# Patient Record
Sex: Female | Born: 1950 | Race: White | Hispanic: No | Smoking: Current every day smoker
Health system: Southern US, Community
[De-identification: ages and names within clinical notes are randomized; demographics above are authoritative.]

## PROBLEM LIST (undated history)

## (undated) HISTORY — PX: ABDOMINAL HYSTERECTOMY: SHX81

## (undated) HISTORY — PX: TONSILLECTOMY: SUR1361

---

## 2007-04-30 ENCOUNTER — Ambulatory Visit: Payer: Self-pay | Admitting: Family Medicine

## 2010-04-04 ENCOUNTER — Ambulatory Visit: Payer: Self-pay | Admitting: Internal Medicine

## 2010-04-06 ENCOUNTER — Ambulatory Visit: Payer: Self-pay | Admitting: Internal Medicine

## 2012-01-09 ENCOUNTER — Encounter: Payer: Self-pay | Admitting: Family Medicine

## 2012-01-09 ENCOUNTER — Ambulatory Visit (INDEPENDENT_AMBULATORY_CARE_PROVIDER_SITE_OTHER): Payer: Commercial Managed Care - PPO | Admitting: Family Medicine

## 2012-01-09 VITALS — BP 104/70 | HR 80 | Temp 98.1°F | Ht 65.25 in | Wt 182.0 lb

## 2012-01-09 DIAGNOSIS — R21 Rash and other nonspecific skin eruption: Secondary | ICD-10-CM

## 2012-01-09 DIAGNOSIS — Z1231 Encounter for screening mammogram for malignant neoplasm of breast: Secondary | ICD-10-CM

## 2012-01-09 DIAGNOSIS — N63 Unspecified lump in unspecified breast: Secondary | ICD-10-CM

## 2012-01-09 DIAGNOSIS — N6310 Unspecified lump in the right breast, unspecified quadrant: Secondary | ICD-10-CM

## 2012-01-09 MED ORDER — FLUOCINONIDE-E 0.05 % EX CREA: TOPICAL_CREAM | Freq: Two times a day (BID) | CUTANEOUS | Status: DC

## 2012-01-09 MED ORDER — DOXYCYCLINE HYCLATE 100 MG PO TABS: 100.0000 mg | ORAL_TABLET | Freq: Two times a day (BID) | ORAL | Status: DC

## 2012-01-09 NOTE — Patient Instructions (Addendum)
Please start Zyrtec- 1 tablet daily. Please take doxycycline 100 mg twice daily x 10 days. Lidex cream twice daily until rash resolves. If no improvement over the next 10 days, please call me.  Please stop by to see Shirlee Limerick on your way out- she will set up your mammogram.

## 2012-01-09 NOTE — Progress Notes (Signed)
  Subjective:    Patient ID: Dorothy Savage, female    DOB: 1950/11/27, 61 y.o.   MRN: [id number]  HPI Very pleasant 61 yo female new to our practice here for two skin issues.  1.  Raised, itchy rash on her arms- comes and goes.  Seems to be worse when she is outside or working as a Lawyer at [employer]. Does not wear gloves that frequently at work.  Rash does not seem to appear after she wears gloves.  2.  Redness, pain cracking finger tips left hand- started a few weeks ago.  Has never drained pus.  She has been putting abx ointment on them which has helped a little. She is a gardener, loves to dig in the soil.  No fevers, chills or weight loss.  PMH significant for sulfa allergy.  Patient Active Problem List  Diagnosis  . Rash and nonspecific skin eruption   No past medical history on file. Past Surgical History  Procedure Date  . Abdominal hysterectomy    History  Substance Use Topics  . Smoking status: Current Every Day Smoker  . Smokeless tobacco: Not on file  . Alcohol Use: Not on file   Family History  Problem Relation Age of Onset  . Alcohol abuse Father    Allergies  Allergen Reactions  . Sulfa Antibiotics Nausea And Vomiting   No current outpatient prescriptions on file prior to visit.   The PMH, PSH, Social History, Family History, Medications, and allergies have been reviewed in [facility], and have been updated if relevant.    Review of Systems See HPI     Objective:   Physical Exam BP 104/70  Pulse 80  Temp 98.1 F (36.7 C)  Ht 5' 5.25" (1.657 m)  Wt 182 lb (82.555 kg)  BMI 30.05 kg/m2 Gen:  Alert, pleasant, NAD Skin: 3 rd - 5th finger tips on left hand, cracking with lateral erythema and warmth- not involving nail or nailbeds- no evidence of paronychia. Raised erythematous macular rash on forearms bilaterally, no drainage.    Assessment & Plan:   1. Rash and nonspecific skin eruption     Two separate rashes- her rash on her fingers tips is  concerning for cellulitis- will start on course of doxycycline (sulfa allergic) to cover MRSA given her exposure in the nursing home. Her arm rashes appear like an allergic or contact dermatitis.  Will start with Lidex- for inflammation and itching.  Add Zyrtec.  ? If this is due the soap they use at the SNF.  I did advise allergy testing.  She will think about it.

## 2012-01-09 NOTE — Addendum Note (Signed)
Addended by: Dianne Dun on: 01/09/2012 09:47 AM   Modules accepted: Orders

## 2012-01-09 NOTE — Addendum Note (Signed)
Addended by: Dianne Dun on: 01/09/2012 08:59 AM   Modules accepted: Orders

## 2012-01-30 ENCOUNTER — Ambulatory Visit: Payer: Self-pay | Admitting: Family Medicine

## 2012-01-31 ENCOUNTER — Encounter: Payer: Self-pay | Admitting: Family Medicine

## 2012-01-31 ENCOUNTER — Encounter: Payer: Self-pay | Admitting: *Deleted

## 2012-02-28 ENCOUNTER — Other Ambulatory Visit: Payer: Self-pay | Admitting: Family Medicine

## 2012-02-28 MED ORDER — FLUOCINONIDE 0.05 % EX SOLN: Freq: Three times a day (TID) | CUTANEOUS | Status: DC

## 2012-03-26 ENCOUNTER — Ambulatory Visit (INDEPENDENT_AMBULATORY_CARE_PROVIDER_SITE_OTHER): Payer: Commercial Managed Care - PPO | Admitting: Family Medicine

## 2012-03-26 ENCOUNTER — Ambulatory Visit: Payer: Self-pay | Admitting: Family Medicine

## 2012-03-26 ENCOUNTER — Encounter: Payer: Self-pay | Admitting: Family Medicine

## 2012-03-26 VITALS — BP 110/84 | HR 76 | Temp 97.7°F | Wt 183.0 lb

## 2012-03-26 DIAGNOSIS — L301 Dyshidrosis [pompholyx]: Secondary | ICD-10-CM

## 2012-03-26 MED ORDER — CLOBETASOL PROPIONATE 0.05 % EX CREA: TOPICAL_CREAM | Freq: Two times a day (BID) | CUTANEOUS | Status: DC

## 2012-03-26 NOTE — Progress Notes (Signed)
  Subjective:    Patient ID: Dorothy Savage, female    DOB: 1950/09/08, 62 y.o.   MRN: [id number]  HPI Very pleasant 62 yo female here for follow up rash.   When she established care in October 2013, she had erythematous, dry cracking finger tips which seems consistent with eczema with superimposed bacterial infection.  We treated her with doxycycline and lidex.  Redness has resolved and cracking has improved but still there.  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  No fevers, chills or weight loss.  PMH significant for sulfa allergy.  Patient Active Problem List  Diagnosis  . Rash and nonspecific skin eruption  . Eczema, dyshidrotic   No past medical history on file. Past Surgical History  Procedure Date  . Abdominal hysterectomy    History  Substance Use Topics  . Smoking status: Current Every Day Smoker  . Smokeless tobacco: Not on file  . Alcohol Use: Not on file   Family History  Problem Relation Age of Onset  . Alcohol abuse Father    Allergies  Allergen Reactions  . Sulfa Antibiotics Nausea And Vomiting   Current Outpatient Prescriptions on File Prior to Visit  Medication Sig Dispense Refill  . fluocinonide (LIDEX) 0.05 % external solution Apply topically 3 (three) times daily.  60 mL  1   The PMH, PSH, Social History, Family History, Medications, and allergies have been reviewed in [facility], and have been updated if relevant.    Review of Systems See HPI     Objective:   Physical Exam BP 110/84  Pulse 76  Temp 97.7 F (36.5 C)  Wt 183 lb (83.008 kg) Gen:  Alert, pleasant, NAD Skin: 3 rd - 5th finger tips on left hand, cracking, erythema has resolved   Assessment & Plan:   1. Eczema, dyshidrotic    Improved but persistent.  Will increase to a super high potency topical steroid- rx sent for clobetasol.  Continue to keep hands as moisturized as possible. Call or return to clinic prn if these symptoms worsen or fail to improve as  anticipated. The patient indicates understanding of these issues and agrees with the plan.

## 2012-03-26 NOTE — Patient Instructions (Addendum)
We are starting Clobetasol twice daily for your eczema.  You have a type of eczema called dyshidrotic eczema.

## 2012-08-02 ENCOUNTER — Other Ambulatory Visit: Payer: Self-pay | Admitting: Family Medicine

## 2014-01-29 ENCOUNTER — Emergency Department: Payer: Self-pay | Admitting: Emergency Medicine

## 2014-01-29 LAB — URINALYSIS, COMPLETE
Bacteria: NONE SEEN
Bilirubin,UR: NEGATIVE
Blood: NEGATIVE
Glucose,UR: NEGATIVE mg/dL (ref 0–75)
KETONE: NEGATIVE
LEUKOCYTE ESTERASE: NEGATIVE
Nitrite: NEGATIVE
PH: 5 (ref 4.5–8.0)
RBC,UR: 7 /HPF (ref 0–5)
SPECIFIC GRAVITY: 1.024 (ref 1.003–1.030)
Squamous Epithelial: 17
WBC UR: NONE SEEN /HPF (ref 0–5)

## 2014-01-29 LAB — BASIC METABOLIC PANEL
Anion Gap: 8 (ref 7–16)
BUN: 14 mg/dL (ref 7–18)
CHLORIDE: 100 mmol/L (ref 98–107)
CO2: 25 mmol/L (ref 21–32)
CREATININE: 1.03 mg/dL (ref 0.60–1.30)
Calcium, Total: 9 mg/dL (ref 8.5–10.1)
EGFR (African American): >60
GFR CALC NON AF AMER: 57 — AB
Glucose: 86 mg/dL (ref 65–99)
OSMOLALITY: 266 (ref 275–301)
POTASSIUM: 3.8 mmol/L (ref 3.5–5.1)
SODIUM: 133 mmol/L — AB (ref 136–145)

## 2014-01-29 LAB — CBC
HCT: 42.5 % (ref 35.0–47.0)
HGB: 14 g/dL (ref 12.0–16.0)
MCH: 30.1 pg (ref 26.0–34.0)
MCHC: 32.9 g/dL (ref 32.0–36.0)
MCV: 91 fL (ref 80–100)
PLATELETS: 257 10*3/uL (ref 150–440)
RBC: 4.65 10*6/uL (ref 3.80–5.20)
RDW: 12.6 % (ref 11.5–14.5)
WBC: 13.3 10*3/uL — AB (ref 3.6–11.0)

## 2014-01-29 LAB — TROPONIN I

## 2014-02-03 LAB — CULTURE, BLOOD (SINGLE)

## 2014-09-28 IMAGING — MG MM CAD DIAGNOSTIC MAMMO
1 series · 8 of 8 positions shown · non-contrast
Comparison: none

REASON FOR EXAM: RT BR DENSITY FU AND YRLY
COMMENTS:

[R CC · right · 8 of 8 slices shown]
[im 1/8]
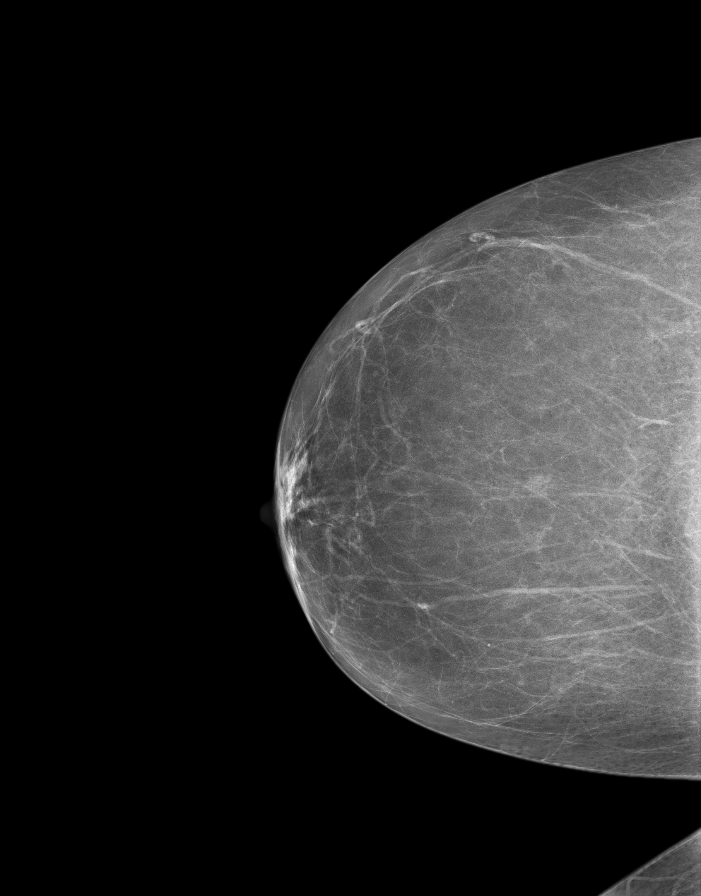
[im 2/8]
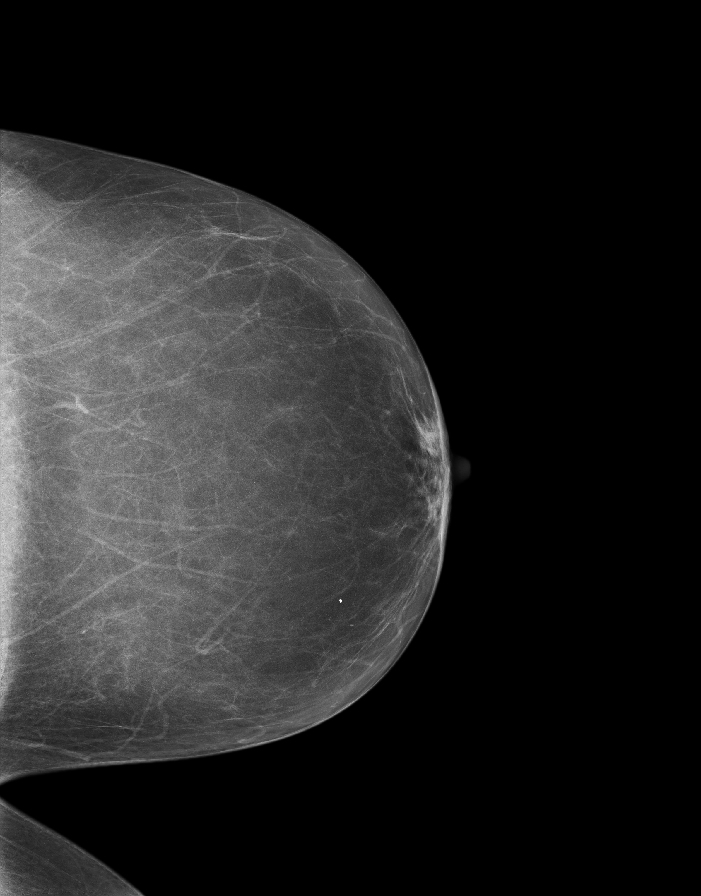
[im 3/8]
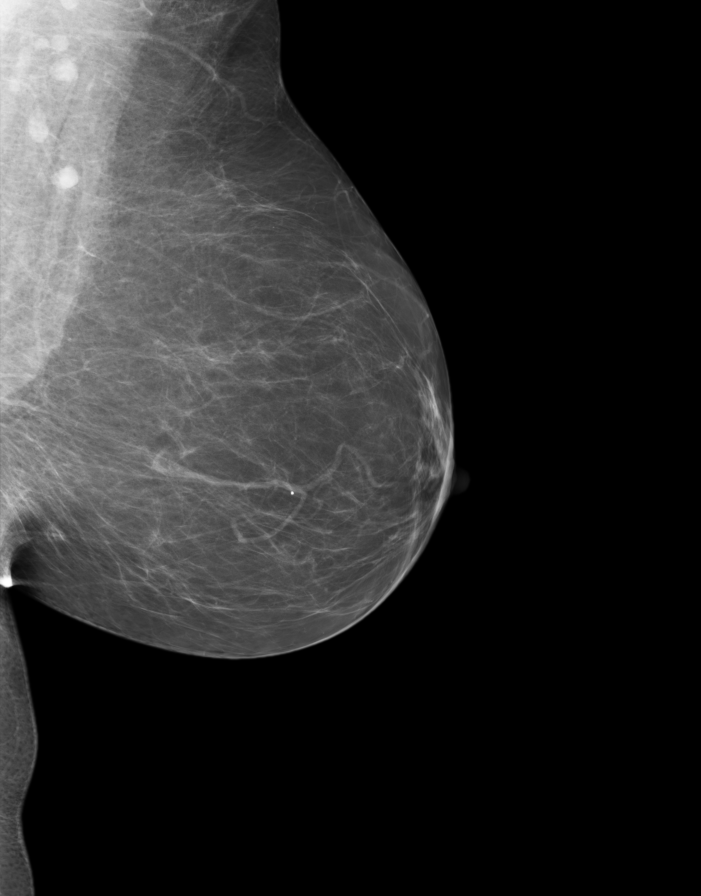
[im 4/8]
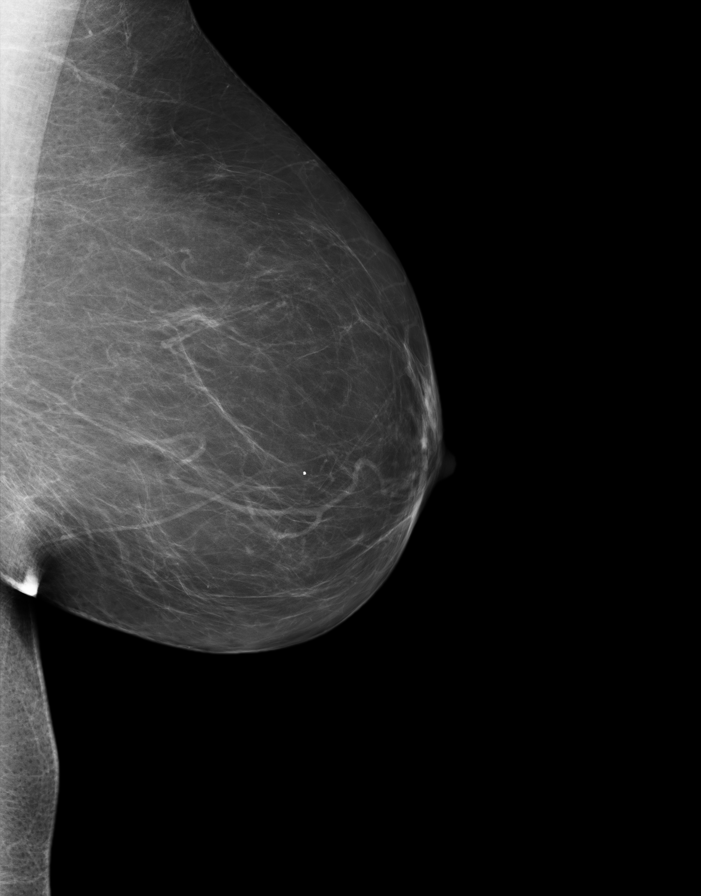
[im 5/8]
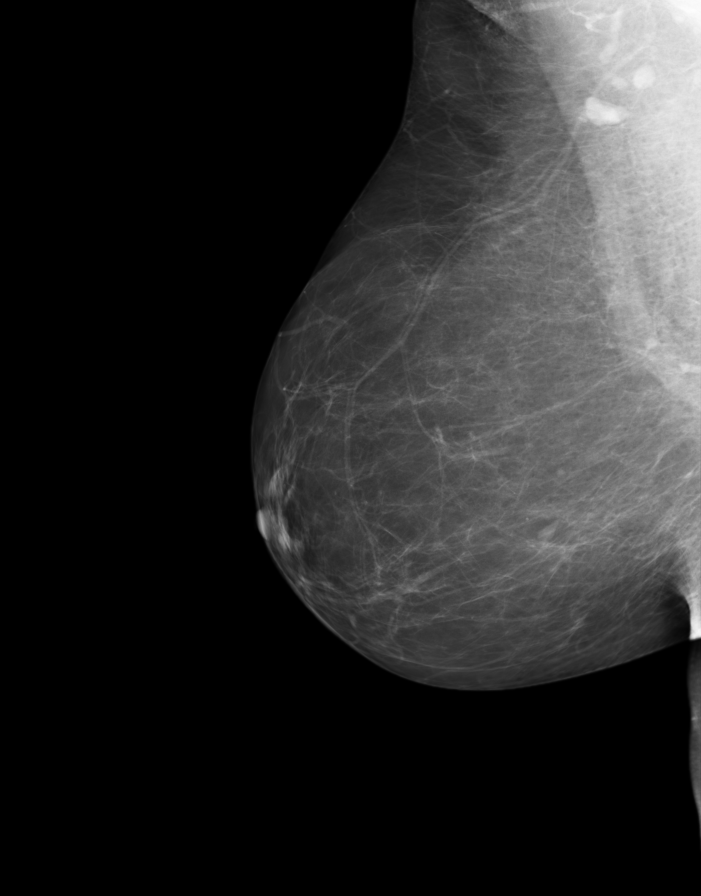
[im 6/8]
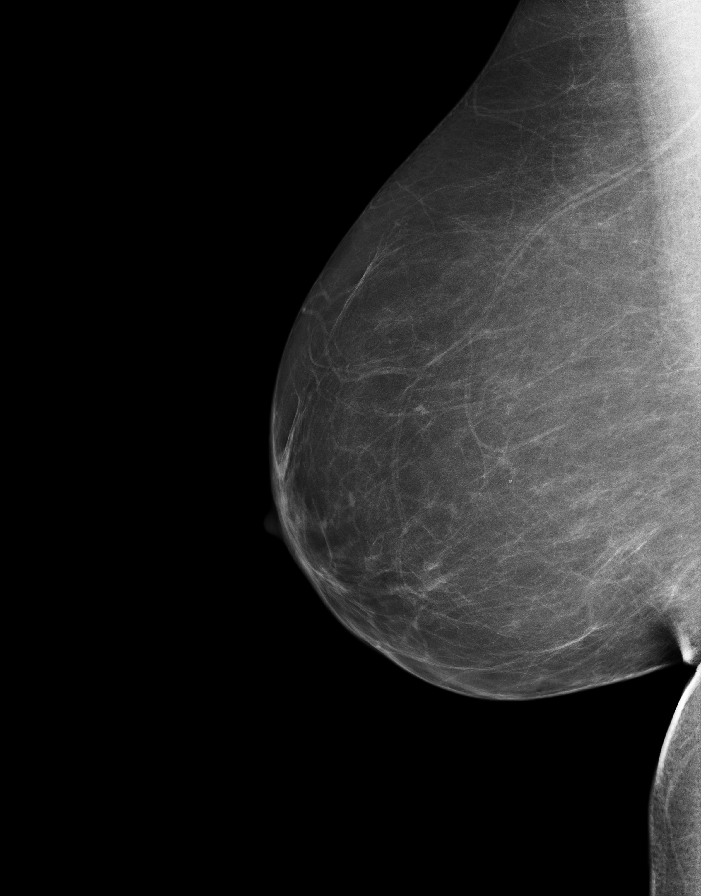
[im 7/8]
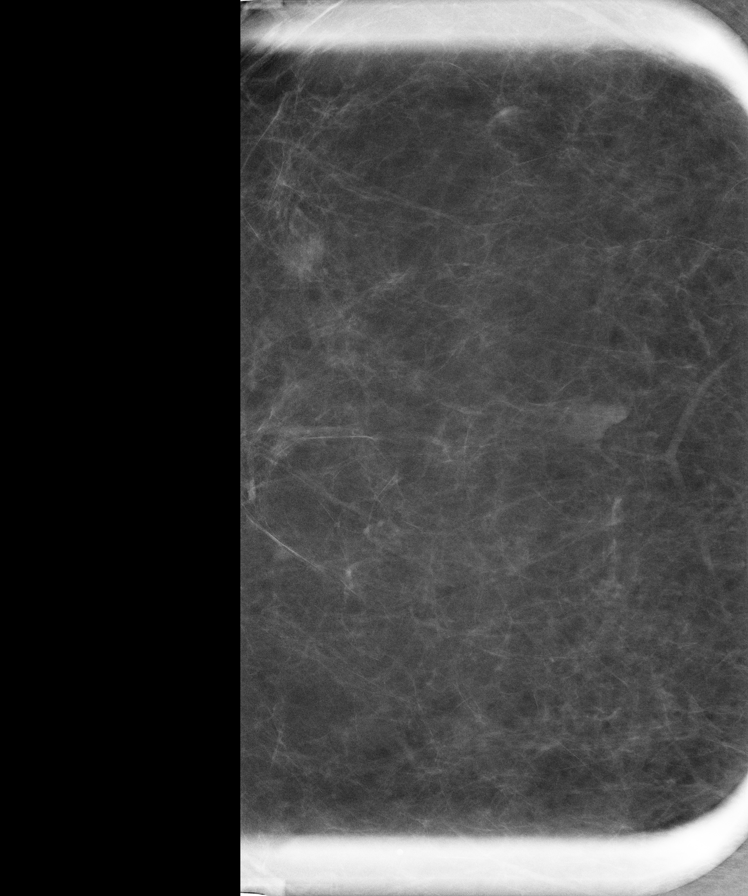
[im 8/8]
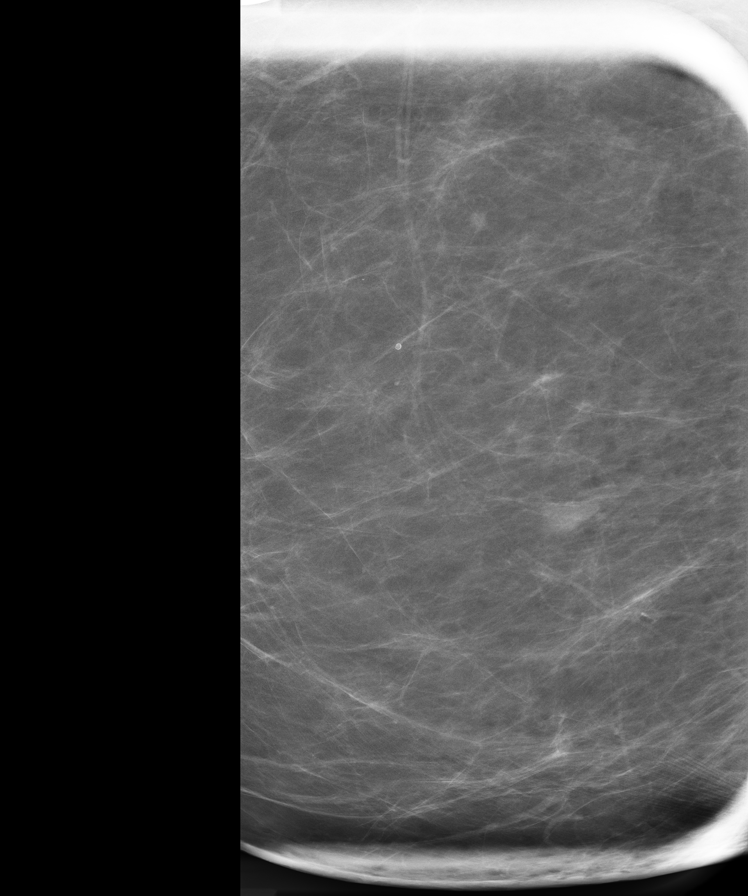

[8 of 8 positions shown; findings below may reference images not displayed]

PROCEDURE:     MAM - MAM DGTL DIAGNOSTIC MAMMO W/CAD  - January 30, 2012  [DATE]

RESULT:     Comparison is made to the study April 04, 2010.

The breasts exhibit an involutional pattern. There is no dominant mass.
There are no malignant appearing groupings of microcalcification. There are
benign-appearing lymph nodes in the axillary regions.
IMPRESSION: There are no findings suspicious for malignancy.

BI-RADS 2: Benign findings.

Recommendation: please continue to encourage yearly mammographic followup.

A NEGATIVE MAMMOGRAM REPORT DOES NOT PRECLUDE BIOPSY OR OTHER EVALUATION OF
A CLINICALLY PALPABLE OR OTHERWISE SUSPICIOUS MASS OR LESION. BREAST CANCER
MAY NOT BE DETECTED BY MAMMOGRAPHY IN UP TO 10% OF CASES.

[REDACTED]

## 2018-01-04 ENCOUNTER — Encounter (INDEPENDENT_AMBULATORY_CARE_PROVIDER_SITE_OTHER): Payer: Self-pay

## 2018-01-04 ENCOUNTER — Encounter: Payer: Self-pay | Admitting: Nurse Practitioner

## 2018-01-04 ENCOUNTER — Ambulatory Visit (INDEPENDENT_AMBULATORY_CARE_PROVIDER_SITE_OTHER): Payer: PPO | Admitting: Nurse Practitioner

## 2018-01-04 VITALS — BP 116/73 | HR 92 | Temp 97.7°F | Ht 65.5 in | Wt 162.5 lb

## 2018-01-04 DIAGNOSIS — Z720 Tobacco use: Secondary | ICD-10-CM

## 2018-01-04 DIAGNOSIS — F17210 Nicotine dependence, cigarettes, uncomplicated: Secondary | ICD-10-CM | POA: Insufficient documentation

## 2018-01-04 DIAGNOSIS — H6982 Other specified disorders of Eustachian tube, left ear: Secondary | ICD-10-CM | POA: Diagnosis not present

## 2018-01-04 DIAGNOSIS — L209 Atopic dermatitis, unspecified: Secondary | ICD-10-CM

## 2018-01-04 MED ORDER — TRIAMCINOLONE ACETONIDE 0.1 % EX CREA: TOPICAL_CREAM | CUTANEOUS | Status: AC | PRN

## 2018-01-04 MED ORDER — PREDNISONE 10 MG PO TABS: 30.0000 mg | ORAL_TABLET | Freq: Every day | ORAL | 0 refills | Status: AC

## 2018-01-04 NOTE — Assessment & Plan Note (Signed)
Acute to left ear.  Educated patient on ETD.  Script sent for Prednisone 30MG  PO QDAY x 5 days.  To return to office if worsening symptoms.  Education printed for patient.

## 2018-01-04 NOTE — Assessment & Plan Note (Signed)
Chronic, ongoing.  Has been smoking since age 67.  Refuses cessation or medication to assist with cessation at this time.  Spirometry testing during next visit, physical exam.

## 2018-01-04 NOTE — Progress Notes (Signed)
BP 116/73 (BP Location: Left Arm, Patient Position: Sitting, Cuff Size: Normal)   Pulse 92   Temp 97.7 F (36.5 C) (Oral)   Ht 5' 5.5" (1.664 m)   Wt 162 lb 8 oz (73.7 kg)   SpO2 96%   BMI 26.63 kg/m    Subjective:    Patient ID: Dorothy Savage, female    DOB: October 06, 1950, 67 y.o.   MRN: [id number]  HPI: Dorothy Savage is a 67 y.o. female presents to establish care, has not seen a healthcare provider in several years and "felt it was time".  Discussed role of NP with patient and discussed what to expect during physical exam in two weeks, to include discussion on spirometry testing for COPD.  Chief Complaint  Patient presents with  . Establish Care  . Ear Problem    Popping, can't hear, sharp pain sometimes. Left Ear.    Eustachian Tube Dysfunction: Started about two weeks ago and then on the weekend it stopped, then started having issues with hearing.  Left ear only.  No prior URI or event before this.  She has not traveled.  Reports issue started after she had been placing her finger in ear to clean out ear wax, she did this over several days.  Has always had trouble with right ear, had to turn head to hear what people were saying when younger.  She reports she has had issues with hearing since 20's.  Denies drainage, bleeding, headache, rhinorrhea, sore throat, nasal congestion, or vision changes.  Has new dog in the house, but denies h/o allergic rhinitis.  Atopic dermatitis: She reports "issues with skin since childhood".  States her mom told her she had eczema and used to put lotion on her skin.  Endorses occasional dry, red patches of skin on her bilateral arms.  Reports having areas at this time and when present they can be "itchy".  She states that episodes wax and wane in presentation.  Use OTC lotion at home, but does not always help.  Denies drainage or open lesions.  Tobacco Abuse: Endorses smoking since age 7, 1/2 to one pack per day.  Has been encouraged to quit  multiple times, but is not interested in cessation at this time.  Discussed performing spirometry testing at next visit, physical, and educated on testing process.  Patient agrees with this plan of care.  She reports occasional nonproductive cough, which she has had for years, and history of episodes of wheezing.  Denies SOB, CP, or chills.  Relevant past medical, surgical, family and social history reviewed and updated as indicated. Interim medical history since our last visit reviewed. Allergies and medications reviewed and updated.  Review of Systems  Constitutional: Negative for activity change, chills, diaphoresis, fatigue and fever.  Respiratory: Negative for cough, chest tightness, shortness of breath and wheezing.   Cardiovascular: Negative for chest pain and palpitations.  Gastrointestinal: Negative for abdominal distention, abdominal pain, constipation, diarrhea, nausea and vomiting.  Endocrine: Negative for cold intolerance, heat intolerance, polydipsia, polyphagia and polyuria.  Musculoskeletal: Negative for arthralgias, back pain, neck pain and neck stiffness.  Skin: Negative for color change and wound.       Dry, red patches reported to arms  Neurological: Negative for dizziness, speech difficulty, weakness, light-headedness and headaches.  Psychiatric/Behavioral: Negative for behavioral problems and decreased concentration. The patient is not nervous/anxious.      Per HPI unless specifically indicated above     Objective:    BP 116/73 (  BP Location: Left Arm, Patient Position: Sitting, Cuff Size: Normal)   Pulse 92   Temp 97.7 F (36.5 C) (Oral)   Ht 5' 5.5" (1.664 m)   Wt 162 lb 8 oz (73.7 kg)   SpO2 96%   BMI 26.63 kg/m   Wt Readings from Last 3 Encounters:  01/04/18 162 lb 8 oz (73.7 kg)    Physical Exam  Constitutional: She is oriented to person, place, and time. She appears well-developed and well-nourished.  HENT:  Head: Normocephalic and atraumatic.  Right  Ear: Hearing, tympanic membrane, external ear and ear canal normal.  Left Ear: Hearing, external ear and ear canal normal. A middle ear effusion is present.  Nose: Nose normal. No rhinorrhea or sinus tenderness. Right sinus exhibits no maxillary sinus tenderness and no frontal sinus tenderness. Left sinus exhibits no maxillary sinus tenderness and no frontal sinus tenderness.  Mouth/Throat: Uvula is midline, oropharynx is clear and moist and mucous membranes are normal.  Mild bulging of TM, able to view bony processes.  Eyes: Pupils are equal, round, and reactive to light. Conjunctivae and EOM are normal.  Cardiovascular: Normal rate, regular rhythm and normal heart sounds.  Pulmonary/Chest: Effort normal and breath sounds normal. She has no wheezes.  Abdominal: Soft. Bowel sounds are normal.  Neurological: She is alert and oriented to person, place, and time.  Skin: Skin is warm and dry.  Scattered erythemic papules, some with areas of scaling, present to bilateral arms.  No drainage or vesicles present.  Psychiatric: She has a normal mood and affect. Her behavior is normal. Judgment and thought content normal.    No results found for this or any previous visit.    Assessment & Plan:   Problem List Items Addressed This Visit      Nervous and Auditory   Eustachian tube dysfunction, left    Acute to left ear.  Educated patient on ETD.  Script sent for Prednisone 30MG  PO QDAY x 5 days.  To return to office if worsening symptoms.  Education printed for patient.        Musculoskeletal and Integument   Atopic dermatitis - Primary    Chronic, with waxing and waning.  Script sent to pharmacy for Triamcinolone 0.1% cream to be used twice a day as needed for flare ups.  Educated on medication.  Use gentle cleansers for daily bathing.      Relevant Medications   triamcinolone cream (KENALOG) 0.1 %     Other   Tobacco abuse    Chronic, ongoing.  Has been smoking since age 43.  Refuses  cessation or medication to assist with cessation at this time.  Spirometry testing during next visit, physical exam.          Follow up plan: Return in about 2 weeks (around 01/18/2018) for annual physical.

## 2018-01-04 NOTE — Assessment & Plan Note (Signed)
Chronic, with waxing and waning.  Script sent to pharmacy for Triamcinolone 0.1% cream to be used twice a day as needed for flare ups.  Educated on medication.  Use gentle cleansers for daily bathing.

## 2018-01-04 NOTE — Patient Instructions (Signed)
Eustachian Tube Dysfunction The eustachian tube connects the middle ear to the back of the nose. It regulates air pressure in the middle ear by allowing air to move between the ear and nose. It also helps to drain fluid from the middle ear space. When the eustachian tube does not function properly, air pressure, fluid, or both can build up in the middle ear. Eustachian tube dysfunction can affect one or both ears. What are the causes? This condition happens when the eustachian tube becomes blocked or cannot open normally. This may result from:  Ear infections.  Colds and other upper respiratory infections.  Allergies.  Irritation, such as from cigarette smoke or acid from the stomach coming up into the esophagus (gastroesophageal reflux).  Sudden changes in air pressure, such as from descending in an airplane.  Abnormal growths in the nose or throat, such as nasal polyps, tumors, or enlarged tissue at the back of the throat (adenoids).  What increases the risk? This condition may be more likely to develop in people who smoke and people who are overweight. Eustachian tube dysfunction may also be more likely to develop in children, especially children who have:  Certain birth defects of the mouth, such as cleft palate.  Large tonsils and adenoids.  What are the signs or symptoms? Symptoms of this condition may include:  A feeling of fullness in the ear.  Ear pain.  Clicking or popping noises in the ear.  Ringing in the ear.  Hearing loss.  Loss of balance.  Symptoms may get worse when the air pressure around you changes, such as when you travel to an area of high elevation or fly on an airplane. How is this diagnosed? This condition may be diagnosed based on:  Your symptoms.  A physical exam of your ear, nose, and throat.  Tests, such as those that measure: ? The movement of your eardrum (tympanogram). ? Your hearing (audiometry).  How is this treated? Treatment  depends on the cause and severity of your condition. If your symptoms are mild, you may be able to relieve your symptoms by moving air into ("popping") your ears. If you have symptoms of fluid in your ears, treatment may include:  Decongestants.  Antihistamines.  Nasal sprays or ear drops that contain medicines that reduce swelling (steroids).  In some cases, you may need to have a procedure to drain the fluid in your eardrum (myringotomy). In this procedure, a small tube is placed in the eardrum to:  Drain the fluid.  Restore the air in the middle ear space.  Follow these instructions at home:  Take over-the-counter and prescription medicines only as told by your health care provider.  Use techniques to help pop your ears as recommended by your health care provider. These may include: ? Chewing gum. ? Yawning. ? Frequent, forceful swallowing. ? Closing your mouth, holding your nose closed, and gently blowing as if you are trying to blow air out of your nose.  Do not do any of the following until your health care provider approves: ? Travel to high altitudes. ? Fly in airplanes. ? Work in a pressurized cabin or room. ? Scuba dive.  Keep your ears dry. Dry your ears completely after showering or bathing.  Do not smoke.  Keep all follow-up visits as told by your health care provider. This is important. Contact a health care provider if:  Your symptoms do not go away after treatment.  Your symptoms come back after treatment.  You are   unable to pop your ears.  You have: ? A fever. ? Pain in your ear. ? Pain in your head or neck. ? Fluid draining from your ear.  Your hearing suddenly changes.  You become very dizzy.  You lose your balance. This information is not intended to replace advice given to you by your health care provider. Make sure you discuss any questions you have with your health care provider. Document Released: 04/02/2015 Document Revised: 08/12/2015  Document Reviewed: 03/25/2014 Elsevier Interactive Patient Education  2018 [employer]  

## 2018-01-07 ENCOUNTER — Encounter: Payer: Self-pay | Admitting: Family Medicine

## 2018-01-24 ENCOUNTER — Encounter: Payer: Self-pay | Admitting: Nurse Practitioner

## 2018-01-28 ENCOUNTER — Encounter: Payer: Self-pay | Admitting: Nurse Practitioner

## 2018-01-28 ENCOUNTER — Ambulatory Visit (INDEPENDENT_AMBULATORY_CARE_PROVIDER_SITE_OTHER): Payer: PPO | Admitting: Nurse Practitioner

## 2018-01-28 ENCOUNTER — Other Ambulatory Visit: Payer: Self-pay

## 2018-01-28 VITALS — BP 112/78 | HR 94 | Temp 97.5°F | Ht 64.5 in | Wt 161.0 lb

## 2018-01-28 DIAGNOSIS — R05 Cough: Secondary | ICD-10-CM

## 2018-01-28 DIAGNOSIS — J449 Chronic obstructive pulmonary disease, unspecified: Secondary | ICD-10-CM

## 2018-01-28 DIAGNOSIS — Z0000 Encounter for general adult medical examination without abnormal findings: Secondary | ICD-10-CM | POA: Diagnosis not present

## 2018-01-28 DIAGNOSIS — F17210 Nicotine dependence, cigarettes, uncomplicated: Secondary | ICD-10-CM | POA: Diagnosis not present

## 2018-01-28 DIAGNOSIS — F5101 Primary insomnia: Secondary | ICD-10-CM

## 2018-01-28 DIAGNOSIS — R059 Cough, unspecified: Secondary | ICD-10-CM

## 2018-01-28 DIAGNOSIS — R350 Frequency of micturition: Secondary | ICD-10-CM | POA: Diagnosis not present

## 2018-01-28 DIAGNOSIS — G4700 Insomnia, unspecified: Secondary | ICD-10-CM | POA: Insufficient documentation

## 2018-01-28 LAB — UA/M W/RFLX CULTURE, ROUTINE
Bilirubin, UA: NEGATIVE
GLUCOSE, UA: NEGATIVE
KETONES UA: NEGATIVE
Leukocytes, UA: NEGATIVE
NITRITE UA: NEGATIVE
Protein, UA: NEGATIVE
UUROB: 0.2 mg/dL (ref 0.2–1.0)
pH, UA: 6 (ref 5.0–7.5)

## 2018-01-28 LAB — MICROSCOPIC EXAMINATION: Bacteria, UA: NONE SEEN

## 2018-01-28 MED ORDER — TRAZODONE HCL 50 MG PO TABS: 25.0000 mg | ORAL_TABLET | Freq: Every day | ORAL | 3 refills | Status: AC

## 2018-01-28 MED ORDER — ALBUTEROL SULFATE HFA 108 (90 BASE) MCG/ACT IN AERS: 2.0000 | INHALATION_SPRAY | Freq: Four times a day (QID) | RESPIRATORY_TRACT | 3 refills | Status: DC | PRN

## 2018-01-28 NOTE — Assessment & Plan Note (Signed)
Continue to encourage cessation.  Patient refuses to quit at this time.

## 2018-01-28 NOTE — Assessment & Plan Note (Addendum)
Chronic, poor sleep pattern.  Script for Trazodone 25MG  PO QHS and will reassess in 3 months.  Offers benefit for mood and sleep.

## 2018-01-28 NOTE — Assessment & Plan Note (Signed)
Acute for 3-4 weeks.  Check urinalysis and C&S today.  No nitrites or leuks, + blood.  Will await culture to determine need for treatment.  Encouraged increase fluid intake.

## 2018-01-28 NOTE — Assessment & Plan Note (Addendum)
Spirometry testing performed with no improvement noted pre/post.  Long term smoker.  Script for Albuterol PRN and will reassess in three months need for maintenance.  Continue to encourage smoking cessation.  Refuses pulmonary consult, due to cost.

## 2018-01-28 NOTE — Patient Instructions (Addendum)
Colorectal Cancer Screening Colorectal cancer screening is a group of tests used to check for colorectal cancer. Colorectal refers to your colon and rectum. Your colon and rectum are located at the end of your large intestine and carry your bowel movements out of your body. Why is colorectal cancer screening done? It is common for abnormal growths (polyps) to form in the lining of your colon, especially as you get older. These polyps can be cancerous or become cancerous. If colorectal cancer is found at an early stage, it is treatable. Who should be screened for colorectal cancer? Screening is recommended for all adults at average risk starting at age 64. Tests may be recommended every 1 to 10 years. Your health care provider may recommend earlier or more frequent screening if you have:  A history of colorectal cancer or polyps.  A family member with a history of colorectal cancer or polyps.  Inflammatory bowel disease, such as ulcerative colitis or Crohn disease.  A type of hereditary colon cancer syndrome.  Colorectal cancer symptoms.  Types of screening tests There are several types of colorectal screening tests. They include:  Guaiac-based fecal occult blood testing.  Fecal immunochemical test (FIT).  Stool DNA test.  Barium enema.  Virtual colonoscopy.  Sigmoidoscopy. During this test, a sigmoidoscope is used to examine your rectum and lower colon. A sigmoidoscope is a flexible tube with a camera that is inserted through your anus into your rectum and lower colon.  Colonoscopy. During this test, a colonoscope is used to examine your entire colon. A colonoscope is a long, thin, flexible tube with a camera. This test examines your entire colon and rectum.  This information is not intended to replace advice given to you by your health care provider. Make sure you discuss any questions you have with your health care provider. Document Released: 08/24/2009 Document Revised:  10/14/2015 Document Reviewed: 06/12/2013 Elsevier Interactive Patient Education  2018 [employer] Bone Densitometry Bone densitometry is an imaging test that uses a special X-ray to measure the amount of calcium and other minerals in your bones (bone density). This test is also known as a bone mineral density test or dual-energy X-ray absorptiometry (DXA). The test can measure bone density at your hip and your spine. It is similar to having a regular X-ray. You may have this test to:  Diagnose a condition that causes weak or thin bones (osteoporosis).  Predict your risk of a broken bone (fracture).  Determine how well osteoporosis treatment is working.  Tell a health care provider about:  Any allergies you have.  All medicines you are taking, including vitamins, herbs, eye drops, creams, and over-the-counter medicines.  Any problems you or family members have had with anesthetic medicines.  Any blood disorders you have.  Any surgeries you have had.  Any medical conditions you have.  Possibility of pregnancy.  Any other medical test you had within the previous 14 days that used contrast material. What are the risks? Generally, this is a safe procedure. However, problems can occur and may include the following:  This test exposes you to a very small amount of radiation.  The risks of radiation exposure may be greater to unborn children.  What happens before the procedure?  Do not take any calcium supplements for 24 hours before having the test. You can otherwise eat and drink what you usually do.  Take off all metal jewelry, eyeglasses, dental appliances, and any other metal objects. What happens during the procedure?  You  may lie on an exam table. There will be an X-ray generator below you and an imaging device above you.  Other devices, such as boxes or braces, may be used to position your body properly for the scan.  You will need to lie still while the machine  slowly scans your body.  The images will show up on a computer monitor. What happens after the procedure? You may need more testing at a later time. This information is not intended to replace advice given to you by your health care provider. Make sure you discuss any questions you have with your health care provider. Document Released: 03/28/2004 Document Revised: 08/12/2015 Document Reviewed: 08/14/2013 Elsevier Interactive Patient Education  2018 [employer].  Mammogram A mammogram is an X-ray of the breasts that is done to check for changes that are not normal. This test can screen for and find any changes that may suggest breast cancer. This test can also help to find other changes and variations in the breast. What happens before the procedure?  Have this test done about 1-2 weeks after your period. This is usually when your breasts are the least tender.  If you are visiting a new doctor or clinic, send any past mammogram images to your new doctor's office.  Wash your breasts and under your arms the day of the test.  Do not use deodorants, perfumes, lotions, or powders on the day of the test.  Take off any jewelry from your neck.  Wear clothes that you can change into and out of easily. What happens during the procedure?  You will undress from the waist up. You will put on a gown.  You will stand in front of the X-ray machine.  Each breast will be placed between two plastic or glass plates. The plates will press down on your breast for a few seconds. Try to stay as relaxed as possible. This does not cause any harm to your breasts. Any discomfort you feel will be very brief.  X-rays will be taken from different angles of each breast. The procedure may vary among doctors and hospitals. What happens after the procedure?  The mammogram will be looked at by a specialist (radiologist).  You may need to do certain parts of the test again. This depends on the quality of the  images.  Ask when your test results will be ready. Make sure you get your test results.  You may go back to your normal activities. This information is not intended to replace advice given to you by your health care provider. Make sure you discuss any questions you have with your health care provider. Document Released: 06/02/2008 Document Revised: 08/12/2015 Document Reviewed: 05/15/2014 Elsevier Interactive Patient Education  2018 [employer]  Chronic Obstructive Pulmonary Disease Chronic obstructive pulmonary disease (COPD) is a long-term (chronic) lung problem. When you have COPD, it is hard for air to get in and out of your lungs. The way your lungs work will never return to normal. Usually the condition gets worse over time. There are things you can do to keep yourself as healthy as possible. Your doctor may treat your condition with:  Medicines.  Quitting smoking, if you smoke.  Rehabilitation. This may involve a team of specialists.  Oxygen.  Exercise and changes to your diet.  Lung surgery.  Comfort measures (palliative care).  Follow these instructions at home: Medicines  Take over-the-counter and prescription medicines only as told by your doctor.  Talk to your doctor before  taking any cough or allergy medicines. You may need to avoid medicines that cause your lungs to be dry. Lifestyle  If you smoke, stop. Smoking makes the problem worse. If you need help quitting, ask your doctor.  Avoid being around things that make your breathing worse. This may include smoke, chemicals, and fumes.  Stay active, but remember to also rest.  Learn and use tips on how to relax.  Make sure you get enough sleep. Most adults need at least 7 hours a night.  Eat healthy foods. Eat smaller meals more often. Rest before meals. Controlled breathing  Learn and use tips on how to control your breathing as told by your doctor. Try: ? Breathing in (inhaling) through your nose for 1  second. Then, pucker your lips and breath out (exhale) through your lips for 2 seconds. ? Putting one hand on your belly (abdomen). Breathe in slowly through your nose for 1 second. Your hand on your belly should move out. Pucker your lips and breathe out slowly through your lips. Your hand on your belly should move in as you breathe out. Controlled coughing  Learn and use controlled coughing to clear mucus from your lungs. The steps are: 1. Lean your head a little forward. 2. Breathe in deeply. 3. Try to hold your breath for 3 seconds. 4. Keep your mouth slightly open while coughing 2 times. 5. Spit any mucus out into a tissue. 6. Rest and do the steps again 1 or 2 times as needed. General instructions  Make sure you get all the shots (vaccines) that your doctor recommends. Ask your doctor about a flu shot and a pneumonia shot.  Use oxygen therapy and therapy to help improve your lungs (pulmonary rehabilitation) if told by your doctor. If you need home oxygen therapy, ask your doctor if you should buy a tool to measure your oxygen level (oximeter).  Make a COPD action plan with your doctor. This helps you know what to do if you feel worse than usual.  Manage any other conditions you have as told by your doctor.  Avoid going outside when it is very hot, cold, or humid.  Avoid people who have a sickness you can catch (contagious).  Keep all follow-up visits as told by your doctor. This is important. Contact a doctor if:  You cough up more mucus than usual.  There is a change in the color or thickness of the mucus.  It is harder to breathe than usual.  Your breathing is faster than usual.  You have trouble sleeping.  You need to use your medicines more often than usual.  You have trouble doing your normal activities such as getting dressed or walking around the house. Get help right away if:  You have shortness of breath while resting.  You have shortness of breath that  stops you from: ? Being able to talk. ? Doing normal activities.  Your chest hurts for longer than 5 minutes.  Your skin color is more blue than usual.  Your pulse oximeter shows that you have low oxygen for longer than 5 minutes.  You have a fever.  You feel too tired to breathe normally. Summary  Chronic obstructive pulmonary disease (COPD) is a long-term lung problem.  The way your lungs work will never return to normal. Usually the condition gets worse over time. There are things you can do to keep yourself as healthy as possible.  Take over-the-counter and prescription medicines only as told by your  doctor.  If you smoke, stop. Smoking makes the problem worse. This information is not intended to replace advice given to you by your health care provider. Make sure you discuss any questions you have with your health care provider. Document Released: 08/23/2007 Document Revised: 08/12/2015 Document Reviewed: 10/31/2012 Elsevier Interactive Patient Education  2017 [employer].

## 2018-01-28 NOTE — Progress Notes (Signed)
BP 112/78 (BP Location: Left Arm, Patient Position: Sitting)   Pulse 94   Temp (!) 97.5 F (36.4 C) (Oral)   Ht 5' 4.5" (1.638 m)   Wt 161 lb (73 kg)   SpO2 96%   BMI 27.21 kg/m    Subjective:    Patient ID: Dorothy Savage, female    DOB: 11-29-1950, 67 y.o.   MRN: [id number]  HPI: Dorothy Savage is a 67 y.o. female presents for annual exam  Chief Complaint  Patient presents with  . Annual Exam   Dorothy Savage is a 67yo Caucasian female who presents today for annual physical.  She does have a chronic, ongoing cough/is a long time smoker.  Also discussed concerns over insomnia and urinary frequency.  Discussed at length preventive screening, such as colonoscopy/mammogram/dexa scan.  She is not interested in having any of these performed.  She reports she "does breast exams at home on myself and never find anything", discussed that mammograms are more specific and recommended.  She continues to state wishes not to have any of the screenings performed at this time and was able to verbalize back conversation with provider.  She also refuses flu vaccine, reporting it "made me sick" and refuses PCV vaccine.  Discussed at length benefits of vaccinations, especially as patient continues to smoke daily. Will continue ongoing conversations with patient at visits.  INSOMNIA Duration: years Satisfied with sleep quality: no Difficulty falling asleep: no Difficulty staying asleep: yes, wakes up 1 to 1 1/2 hours after falling asleep and then "toss and turn the rest of the night" Waking a few hours after sleep onset: yes Early morning awakenings: yes Daytime hypersomnolence: yes Wakes feeling refreshed: no Good sleep hygiene: no Apnea: no Snoring: no Depressed/anxious mood: yes Recent stress: no Restless legs/nocturnal leg cramps: no Chronic pain/arthritis: no History of sleep study: no Treatments attempted: melatonin and benadryl   COPD Has been smoking 53 years, smokes a little less than  a pack a day.  She states she has had pneumonia three times in her past and feels this "has made me cough more".  She reports chronic cough for over two months.  Has an old Ventolin inhaler she received when she had PNA, which she states she still uses on occasion.  On review it is expired and dicussed this with her. Duration: months Circumstances of initial development of cough: unknown Cough severity: mild Cough description: productive, at times with thick yellow phelgm per patient report Aggravating factors:  worse outside than inside, intermittent.  Also worse with talking. Alleviating factors: used to have Ventolin inhaler and nothing Status:  fluctuating Treatments attempted: Ventolin Wheezing: yes Shortness of breath: yes Chest pain: no Chest tightness:no Nasal congestion: no Runny nose: no Postnasal drip: no Frequent throat clearing or swallowing: yes Hemoptysis: no Fevers: no Night sweats: no Weight loss: no Heartburn: no Recent foreign travel: no Tuberculosis contacts: no  URINARY FREQUENCY: She reports she has had increased urination pattern for 3-4 weeks.  Is voiding "every hour on the hour".  She has also been using the bathroom frequently at night.  At home is drinking her normal amounts of fluid, coffee/tea/water.  Denies dysuria, hematuria.  She states she is feeling some urgency and then "itchy" after urination.  Denies vaginal discharge or odor.  She can think of nothing that makes it better or worse.  She is post menopausal, had a hysterectomy in early 84's.  NICOTINE DEPENDENCE: Continues to smoke, a little less  than 1/2 pack per day.  Discussed at length various treatment options for quitting, but at this time patient does not wish to quit.  Relevant past medical, surgical, family and social history reviewed and updated as indicated. Interim medical history since our last visit reviewed. Allergies and medications reviewed and updated.  Review of Systems    Constitutional: Negative for activity change, appetite change, diaphoresis, fatigue, fever and unexpected weight change.  HENT: Negative for dental problem, hearing loss, mouth sores, postnasal drip, rhinorrhea, sinus pressure, sinus pain, sneezing, sore throat, trouble swallowing and voice change.   Eyes: Negative for pain, discharge, redness and visual disturbance.  Respiratory: Positive for wheezing. Negative for cough, chest tightness and shortness of breath.        Occasional wheezing reported  Cardiovascular: Negative for chest pain, palpitations and leg swelling.  Gastrointestinal: Negative for abdominal distention, abdominal pain, constipation, diarrhea, nausea and vomiting.  Endocrine: Negative for cold intolerance, heat intolerance, polydipsia, polyphagia and polyuria.  Genitourinary: Positive for frequency and urgency. Negative for decreased urine volume, difficulty urinating, dysuria, hematuria, vaginal bleeding, vaginal discharge and vaginal pain.  Musculoskeletal: Negative.   Skin: Negative.   Allergic/Immunologic: Negative.   Neurological: Negative for dizziness, numbness and headaches.  Hematological: Negative.   Psychiatric/Behavioral: Negative.     Per HPI unless specifically indicated above     Objective:    BP 112/78 (BP Location: Left Arm, Patient Position: Sitting)   Pulse 94   Temp (!) 97.5 F (36.4 C) (Oral)   Ht 5' 4.5" (1.638 m)   Wt 161 lb (73 kg)   SpO2 96%   BMI 27.21 kg/m   Wt Readings from Last 3 Encounters:  01/28/18 161 lb (73 kg)  01/04/18 162 lb 8 oz (73.7 kg)  03/26/12 183 lb (83 kg)    Physical Exam  Constitutional: She is oriented to person, place, and time. She appears well-developed and well-nourished.  HENT:  Head: Normocephalic and atraumatic.  Right Ear: Hearing, tympanic membrane, external ear and ear canal normal.  Left Ear: Hearing, tympanic membrane, external ear and ear canal normal.  Nose: Nose normal. Right sinus exhibits  no maxillary sinus tenderness and no frontal sinus tenderness. Left sinus exhibits no maxillary sinus tenderness and no frontal sinus tenderness.  Mouth/Throat: Oropharynx is clear and moist.  Eyes: Pupils are equal, round, and reactive to light. Conjunctivae and EOM are normal. Right eye exhibits no discharge. Left eye exhibits no discharge.  Neck: Normal range of motion. Neck supple. No JVD present. Carotid bruit is not present. No thyromegaly present.  Cardiovascular: Normal rate, regular rhythm, normal heart sounds and intact distal pulses.  Pulmonary/Chest: Effort normal. She has wheezes. Right breast exhibits no inverted nipple, no mass, no nipple discharge, no skin change and no tenderness. Left breast exhibits no inverted nipple, no mass, no nipple discharge, no skin change and no tenderness.  Intermittent expiratory wheezes scattered throughout.  Intermittent nonproductive cough noted.  Abdominal: Soft. Bowel sounds are normal. There is no splenomegaly or hepatomegaly.  Musculoskeletal: Normal range of motion.  Lymphadenopathy:    She has no cervical adenopathy.  Neurological: She is alert and oriented to person, place, and time. She has normal reflexes.  Reflex Scores:      Brachioradialis reflexes are 2+ on the right side and 2+ on the left side.      Patellar reflexes are 2+ on the right side and 2+ on the left side. Skin: Skin is warm and dry. Nails show clubbing.  Slight clubbing bilateral fingernails.  Psychiatric: She has a normal mood and affect. Her behavior is normal. Judgment and thought content normal.    Results for orders placed or performed in visit on 01/31/12  [facility] MAMMOGRAPHY  Result Value Ref Range   [facility] Mammogram normal       Assessment & Plan:   Problem List Items Addressed This Visit      Respiratory   COPD (chronic obstructive pulmonary disease) ([place])    Spirometry testing performed with no improvement noted pre/post.  Long term smoker.  Script for  Albuterol PRN and will reassess in three months need for maintenance.  Continue to encourage smoking cessation.  Refuses pulmonary consult, due to cost.      Relevant Medications   albuterol (PROVENTIL HFA;VENTOLIN HFA) 108 (90 Base) MCG/ACT inhaler     Other   Nicotine dependence, cigarettes, uncomplicated    Continue to encourage cessation.  Patient refuses to quit at this time.      Urinary frequency    Acute for 3-4 weeks.  Check urinalysis and C&S today.  No nitrites or leuks, + blood.  Will await culture to determine need for treatment.  Encouraged increase fluid intake.      Relevant Orders   CBC with Differential/Platelet   UA/M w/rflx Culture, Routine   Insomnia    Chronic, poor sleep pattern.  Script for Trazodone 25MG PO QHS and will reassess in 3 months.  Offers benefit for mood and sleep.       Other Visit Diagnoses    Encounter for annual physical exam    -  Primary   Relevant Orders   HgB A1c   TSH   CBC with Differential/Platelet   Comp Met (CMET)   Lipid Panel w/o Chol/HDL Ratio       Follow up plan: Return in about 3 months (around 04/30/2018) for COPD.

## 2018-01-29 LAB — CBC WITH DIFFERENTIAL/PLATELET
BASOS: 1 %
Basophils Absolute: 0 10*3/uL (ref 0.0–0.2)
EOS (ABSOLUTE): 0.2 10*3/uL (ref 0.0–0.4)
Eos: 3 %
Hematocrit: 53.8 % — ABNORMAL HIGH (ref 34.0–46.6)
Hemoglobin: 18.5 g/dL — ABNORMAL HIGH (ref 11.1–15.9)
IMMATURE GRANS (ABS): 0 10*3/uL (ref 0.0–0.1)
IMMATURE GRANULOCYTES: 0 %
LYMPHS: 28 %
Lymphocytes Absolute: 1.9 10*3/uL (ref 0.7–3.1)
MCH: 31.4 pg (ref 26.6–33.0)
MCHC: 34.4 g/dL (ref 31.5–35.7)
MCV: 91 fL (ref 79–97)
MONOS ABS: 0.5 10*3/uL (ref 0.1–0.9)
Monocytes: 7 %
NEUTROS PCT: 61 %
Neutrophils Absolute: 4.1 10*3/uL (ref 1.4–7.0)
PLATELETS: 171 10*3/uL (ref 150–450)
RBC: 5.89 x10E6/uL — ABNORMAL HIGH (ref 3.77–5.28)
RDW: 12.7 % (ref 12.3–15.4)
WBC: 6.8 10*3/uL (ref 3.4–10.8)

## 2018-01-29 LAB — HEMOGLOBIN A1C
ESTIMATED AVERAGE GLUCOSE: 100 mg/dL
HEMOGLOBIN A1C: 5.1 % (ref 4.8–5.6)

## 2018-01-29 LAB — COMPREHENSIVE METABOLIC PANEL
ALT: 16 IU/L (ref 0–32)
AST: 21 IU/L (ref 0–40)
Albumin/Globulin Ratio: 1.2 (ref 1.2–2.2)
Albumin: 4.2 g/dL (ref 3.6–4.8)
Alkaline Phosphatase: 85 IU/L (ref 39–117)
BUN/Creatinine Ratio: 9 — ABNORMAL LOW (ref 12–28)
BUN: 7 mg/dL — AB (ref 8–27)
Bilirubin Total: 0.6 mg/dL (ref 0.0–1.2)
CALCIUM: 9.8 mg/dL (ref 8.7–10.3)
CO2: 24 mmol/L (ref 20–29)
CREATININE: 0.74 mg/dL (ref 0.57–1.00)
Chloride: 101 mmol/L (ref 96–106)
GFR, EST AFRICAN AMERICAN: 96 mL/min/{1.73_m2} (ref >59)
GFR, EST NON AFRICAN AMERICAN: 84 mL/min/{1.73_m2} (ref >59)
GLUCOSE: 88 mg/dL (ref 65–99)
Globulin, Total: 3.5 g/dL (ref 1.5–4.5)
POTASSIUM: 4.4 mmol/L (ref 3.5–5.2)
Sodium: 142 mmol/L (ref 134–144)
TOTAL PROTEIN: 7.7 g/dL (ref 6.0–8.5)

## 2018-01-29 LAB — LIPID PANEL W/O CHOL/HDL RATIO
Cholesterol, Total: 173 mg/dL (ref 100–199)
HDL: 60 mg/dL (ref >39)
LDL CALC: 96 mg/dL (ref 0–99)
TRIGLYCERIDES: 87 mg/dL (ref 0–149)
VLDL CHOLESTEROL CAL: 17 mg/dL (ref 5–40)

## 2018-01-29 LAB — TSH: TSH: 2.41 u[IU]/mL (ref 0.450–4.500)

## 2018-05-03 ENCOUNTER — Ambulatory Visit: Payer: PPO | Admitting: Nurse Practitioner

## 2018-12-17 ENCOUNTER — Encounter: Payer: Self-pay | Admitting: Family Medicine

## 2018-12-17 ENCOUNTER — Other Ambulatory Visit: Payer: Self-pay

## 2018-12-17 ENCOUNTER — Ambulatory Visit (INDEPENDENT_AMBULATORY_CARE_PROVIDER_SITE_OTHER): Payer: PPO | Admitting: Family Medicine

## 2018-12-17 VITALS — BP 128/85 | HR 109 | Temp 98.7°F | Ht 64.5 in | Wt 167.0 lb

## 2018-12-17 DIAGNOSIS — H6122 Impacted cerumen, left ear: Secondary | ICD-10-CM | POA: Diagnosis not present

## 2018-12-17 NOTE — Progress Notes (Signed)
BP 128/85   Pulse (!) 109   Temp 98.7 F (37.1 C) (Oral)   Ht 5' 4.5" (1.638 m)   Wt 167 lb (75.8 kg)   SpO2 92%   BMI 28.22 kg/m    Subjective:    Patient ID: Dorothy Savage, female    DOB: [birth year], 68 y.o.   MRN: [id number]  HPI: Dorothy Savage is a 68 y.o. female  Chief Complaint  Patient presents with  . Ear Problem    pt states that her left ear feels like stopped up for about a weeks. started with a headache   EAG CLOGGED  Duration: about a week Involved ear(s):  left Sensation of feeling clogged/plugged: yes Decreased/muffled hearing:yes Ear pain: no Fever: no Otorrhea: no Hearing loss: yes Upper respiratory infection symptoms: yes Using Q-Tips: no Status: worse History of cerumenosis: yes Treatments attempted: none  Relevant past medical, surgical, family and social history reviewed and updated as indicated. Interim medical history since our last visit reviewed. Allergies and medications reviewed and updated.  Review of Systems  Constitutional: Negative.   HENT: Positive for ear pain and rhinorrhea. Negative for congestion, dental problem, drooling, ear discharge, facial swelling, hearing loss, mouth sores, nosebleeds, postnasal drip, sinus pressure, sinus pain, sneezing, sore throat, tinnitus, trouble swallowing and voice change.   Respiratory: Negative.   Cardiovascular: Negative.   Neurological: Positive for headaches.  Psychiatric/Behavioral: Negative.     Per HPI unless specifically indicated above     Objective:    BP 128/85   Pulse (!) 109   Temp 98.7 F (37.1 C) (Oral)   Ht 5' 4.5" (1.638 m)   Wt 167 lb (75.8 kg)   SpO2 92%   BMI 28.22 kg/m   Wt Readings from Last 3 Encounters:  12/17/18 167 lb (75.8 kg)  01/28/18 161 lb (73 kg)  01/04/18 162 lb 8 oz (73.7 kg)    Physical Exam Vitals signs and nursing note reviewed.  Constitutional:      General: She is not in acute distress.    Appearance: Normal appearance. She is not  ill-appearing, toxic-appearing or diaphoretic.  HENT:     Head: Normocephalic and atraumatic.     Right Ear: External ear normal.     Left Ear: External ear normal. There is impacted cerumen.     Nose: Nose normal.     Mouth/Throat:     Mouth: Mucous membranes are moist.     Pharynx: Oropharynx is clear.  Eyes:     General: No scleral icterus.       Right eye: No discharge.        Left eye: No discharge.     Extraocular Movements: Extraocular movements intact.     Conjunctiva/sclera: Conjunctivae normal.     Pupils: Pupils are equal, round, and reactive to light.  Neck:     Musculoskeletal: Normal range of motion and neck supple.  Cardiovascular:     Rate and Rhythm: Normal rate and regular rhythm.     Pulses: Normal pulses.     Heart sounds: Normal heart sounds. No murmur. No friction rub. No gallop.   Pulmonary:     Effort: Pulmonary effort is normal. No respiratory distress.     Breath sounds: Normal breath sounds. No stridor. No wheezing, rhonchi or rales.  Chest:     Chest wall: No tenderness.  Musculoskeletal: Normal range of motion.  Skin:    General: Skin is warm and dry.  Capillary Refill: Capillary refill takes less than 2 seconds.     Coloration: Skin is not jaundiced or pale.     Findings: No bruising, erythema, lesion or rash.  Neurological:     General: No focal deficit present.     Mental Status: She is alert and oriented to person, place, and time. Mental status is at baseline.  Psychiatric:        Mood and Affect: Mood normal.        Behavior: Behavior normal.        Thought Content: Thought content normal.        Judgment: Judgment normal.     Results for orders placed or performed in visit on 01/28/18  Microscopic Examination   URINE  Result Value Ref Range   WBC, UA 0-5 0 - 5 /hpf   RBC, UA 3-10 (A) 0 - 2 /hpf   Epithelial Cells (non renal) 0-10 0 - 10 /hpf   Bacteria, UA None seen None seen/Few  HgB A1c  Result Value Ref Range   Hgb A1c  MFr Bld 5.1 4.8 - 5.6 %   Est. average glucose Bld gHb Est-mCnc 100 mg/dL  TSH  Result Value Ref Range   TSH 2.410 0.450 - 4.500 uIU/mL  CBC with Differential/Platelet  Result Value Ref Range   WBC 6.8 3.4 - 10.8 x10E3/uL   RBC 5.89 (H) 3.77 - 5.28 x10E6/uL   Hemoglobin 18.5 (H) 11.1 - 15.9 g/dL   Hematocrit 53.8 (H) 34.0 - 46.6 %   MCV 91 79 - 97 fL   MCH 31.4 26.6 - 33.0 pg   MCHC 34.4 31.5 - 35.7 g/dL   RDW 12.7 12.3 - 15.4 %   Platelets 171 150 - 450 x10E3/uL   Neutrophils 61 Not Estab. %   Lymphs 28 Not Estab. %   Monocytes 7 Not Estab. %   Eos 3 Not Estab. %   Basos 1 Not Estab. %   Neutrophils Absolute 4.1 1.4 - 7.0 x10E3/uL   Lymphocytes Absolute 1.9 0.7 - 3.1 x10E3/uL   Monocytes Absolute 0.5 0.1 - 0.9 x10E3/uL   EOS (ABSOLUTE) 0.2 0.0 - 0.4 x10E3/uL   Basophils Absolute 0.0 0.0 - 0.2 x10E3/uL   Immature Granulocytes 0 Not Estab. %   Immature Grans (Abs) 0.0 0.0 - 0.1 x10E3/uL  Comp Met (CMET)  Result Value Ref Range   Glucose 88 65 - 99 mg/dL   BUN 7 (L) 8 - 27 mg/dL   Creatinine, Ser 0.74 0.57 - 1.00 mg/dL   GFR calc non Af Amer 84 >59 mL/min/1.73   GFR calc Af Amer 96 >59 mL/min/1.73   BUN/Creatinine Ratio 9 (L) 12 - 28   Sodium 142 134 - 144 mmol/L   Potassium 4.4 3.5 - 5.2 mmol/L   Chloride 101 96 - 106 mmol/L   CO2 24 20 - 29 mmol/L   Calcium 9.8 8.7 - 10.3 mg/dL   Total Protein 7.7 6.0 - 8.5 g/dL   Albumin 4.2 3.6 - 4.8 g/dL   Globulin, Total 3.5 1.5 - 4.5 g/dL   Albumin/Globulin Ratio 1.2 1.2 - 2.2   Bilirubin Total 0.6 0.0 - 1.2 mg/dL   Alkaline Phosphatase 85 39 - 117 IU/L   AST 21 0 - 40 IU/L   ALT 16 0 - 32 IU/L  Lipid Panel w/o Chol/HDL Ratio  Result Value Ref Range   Cholesterol, Total 173 100 - 199 mg/dL   Triglycerides 87 0 - 149 mg/dL  HDL 60 >39 mg/dL   VLDL Cholesterol Cal 17 5 - 40 mg/dL   LDL Calculated 96 0 - 99 mg/dL  UA/M w/rflx Culture, Routine   Specimen: Urine   URINE  Result Value Ref Range   Specific Gravity, UA <1.005  (L) 1.005 - 1.030   pH, UA 6.0 5.0 - 7.5   Color, UA Yellow Yellow   Appearance Ur Clear Clear   Leukocytes, UA Negative Negative   Protein, UA Negative Negative/Trace   Glucose, UA Negative Negative   Ketones, UA Negative Negative   RBC, UA 2+ (A) Negative   Bilirubin, UA Negative Negative   Urobilinogen, Ur 0.2 0.2 - 1.0 mg/dL   Nitrite, UA Negative Negative   Microscopic Examination See below:       Assessment & Plan:   Problem List Items Addressed This Visit    None    Visit Diagnoses    Impacted cerumen of left ear    -  Primary   Ear flushed today with good results and return of hearing       Follow up plan: Return if symptoms worsen or fail to improve.

## 2019-05-22 ENCOUNTER — Encounter: Payer: Self-pay | Admitting: Nurse Practitioner

## 2019-05-22 ENCOUNTER — Ambulatory Visit
Admission: RE | Admit: 2019-05-22 | Discharge: 2019-05-22 | Disposition: A | Discharge status: Home / Self Care | Payer: PPO | Source: Ambulatory Visit | Attending: Nurse Practitioner | Admitting: Nurse Practitioner

## 2019-05-22 ENCOUNTER — Ambulatory Visit
Admission: RE | Admit: 2019-05-22 | Discharge: 2019-05-22 | Disposition: A | Discharge status: Home / Self Care | Payer: PPO | Attending: Nurse Practitioner | Admitting: Nurse Practitioner

## 2019-05-22 ENCOUNTER — Other Ambulatory Visit: Payer: Self-pay

## 2019-05-22 ENCOUNTER — Ambulatory Visit (INDEPENDENT_AMBULATORY_CARE_PROVIDER_SITE_OTHER): Payer: PPO | Admitting: Nurse Practitioner

## 2019-05-22 VITALS — BP 117/78

## 2019-05-22 DIAGNOSIS — J441 Chronic obstructive pulmonary disease with (acute) exacerbation: Secondary | ICD-10-CM

## 2019-05-22 DIAGNOSIS — F17210 Nicotine dependence, cigarettes, uncomplicated: Secondary | ICD-10-CM

## 2019-05-22 DIAGNOSIS — R05 Cough: Secondary | ICD-10-CM | POA: Diagnosis not present

## 2019-05-22 DIAGNOSIS — R0602 Shortness of breath: Secondary | ICD-10-CM | POA: Diagnosis not present

## 2019-05-22 MED ORDER — AZITHROMYCIN 250 MG PO TABS: ORAL_TABLET | ORAL | 0 refills | Status: DC

## 2019-05-22 MED ORDER — BENZONATATE 100 MG PO CAPS: 100.0000 mg | ORAL_CAPSULE | Freq: Three times a day (TID) | ORAL | 0 refills | Status: DC | PRN

## 2019-05-22 MED ORDER — ALBUTEROL SULFATE HFA 108 (90 BASE) MCG/ACT IN AERS: 2.0000 | INHALATION_SPRAY | Freq: Four times a day (QID) | RESPIRATORY_TRACT | 4 refills | Status: AC | PRN

## 2019-05-22 MED ORDER — PREDNISONE 10 MG PO TABS: ORAL_TABLET | ORAL | 0 refills | Status: DC

## 2019-05-22 NOTE — Assessment & Plan Note (Addendum)
Suspect COPD exacerbation, but have recommended Covid testing and provided directions on how to obtain.  Will obtain CXR due to h/o PNA.  Scripts sent for Zpack, Prednisone, and Tessalon.  If PNA present will add on second abx.  Albuterol inhaler refilled.  Recommend plenty of rest and fluids.  If any worsening symptoms she is to be seen immediately in ER setting or return to office. Will follow-up with her in 2 weeks.

## 2019-05-22 NOTE — Assessment & Plan Note (Signed)
I have recommended complete cessation of tobacco use. I have discussed various options available for assistance with tobacco cessation including over the counter methods (Nicotine gum, patch and lozenges). We also discussed prescription options (Chantix, Nicotine Inhaler / Nasal Spray). The patient is not interested in pursuing any prescription tobacco cessation options at this time.  

## 2019-05-22 NOTE — Patient Instructions (Signed)

## 2019-05-22 NOTE — Progress Notes (Signed)
BP 117/78    Subjective:    Patient ID: Dorothy Savage, female    DOB: 07/04/1950, 69 y.o.   MRN: [id number]  HPI: Dorothy Savage is a 69 y.o. female  Chief Complaint  Patient presents with  . Cough    pt states she has had a productive cough for 2 weeks     . This visit was completed via telephone due to the restrictions of the COVID-19 pandemic. All issues as above were discussed and addressed but no physical exam was performed. If it was felt that the patient should be evaluated in the office, they were directed there. The patient verbally consented to this visit. Patient was unable to complete an audio/visual visit due to Lack of equipment. Due to the catastrophic nature of the COVID-19 pandemic, this visit was done through audio contact only. . Location of the patient: home . Location of the provider: home . Those involved with this call:  . Provider: Marnee Guarneri, DNP . CMA: Yvonna Alanis, CMA . Front Desk/Registration: Don Perking  . Time spent on call: 15 minutes on the phone discussing health concerns. 10 minutes total spent in review of patient's record and preparation of their chart.  . I verified patient identity using two factors (patient name and date of birth). Patient consents verbally to being seen via telemedicine visit today.    COUGH Has had a cold for two weeks.  Started out with a cough.  Does have underlying COPD. She is a smoker, 1/2 PPD.  No loss of taste or smell.  Has not been around anyone with Covid that she knows of, is not leaving her house and having everything delivered to her.  Denies loss of taste or smell.  No N&V, diarrhea, abdominal pain.   Duration: weeks Circumstances of initial development of cough: URI Cough severity: moderate Cough description: sometimes greenish and yellow Aggravating factors:  when she is active it is worse Alleviating factors: nothing Status:  fluctuating Treatments attempted: none Wheezing:  yes Shortness of breath: yes Chest pain: no Chest tightness:no Nasal congestion: no Runny nose: yes Postnasal drip: yes Frequent throat clearing or swallowing: yes Hemoptysis: no Fevers: no Night sweats: no Weight loss: no Heartburn: no Recent foreign travel: no Tuberculosis contacts: no  Relevant past medical, surgical, family and social history reviewed and updated as indicated. Interim medical history since our last visit reviewed. Allergies and medications reviewed and updated.  Review of Systems  Constitutional: Positive for fatigue. Negative for activity change, appetite change, diaphoresis and fever.  HENT: Positive for postnasal drip and rhinorrhea. Negative for congestion, ear discharge, ear pain, sinus pressure, sinus pain, sneezing, sore throat and voice change.   Respiratory: Positive for cough, shortness of breath and wheezing. Negative for chest tightness.   Cardiovascular: Negative for chest pain, palpitations and leg swelling.  Gastrointestinal: Negative.   Neurological: Negative.   Psychiatric/Behavioral: Negative.     Per HPI unless specifically indicated above     Objective:    BP 117/78   Wt Readings from Last 3 Encounters:  12/17/18 167 lb (75.8 kg)  01/28/18 161 lb (73 kg)  01/04/18 162 lb 8 oz (73.7 kg)    Physical Exam   Unable to perform due to telephone only visit.  Results for orders placed or performed in visit on 01/28/18  Microscopic Examination   URINE  Result Value Ref Range   WBC, UA 0-5 0 - 5 /hpf   RBC, UA 3-10 (A) 0 -  2 /hpf   Epithelial Cells (non renal) 0-10 0 - 10 /hpf   Bacteria, UA None seen None seen/Few  HgB A1c  Result Value Ref Range   Hgb A1c MFr Bld 5.1 4.8 - 5.6 %   Est. average glucose Bld gHb Est-mCnc 100 mg/dL  TSH  Result Value Ref Range   TSH 2.410 0.450 - 4.500 uIU/mL  CBC with Differential/Platelet  Result Value Ref Range   WBC 6.8 3.4 - 10.8 x10E3/uL   RBC 5.89 (H) 3.77 - 5.28 x10E6/uL   Hemoglobin  18.5 (H) 11.1 - 15.9 g/dL   Hematocrit 53.8 (H) 34.0 - 46.6 %   MCV 91 79 - 97 fL   MCH 31.4 26.6 - 33.0 pg   MCHC 34.4 31.5 - 35.7 g/dL   RDW 12.7 12.3 - 15.4 %   Platelets 171 150 - 450 x10E3/uL   Neutrophils 61 Not Estab. %   Lymphs 28 Not Estab. %   Monocytes 7 Not Estab. %   Eos 3 Not Estab. %   Basos 1 Not Estab. %   Neutrophils Absolute 4.1 1.4 - 7.0 x10E3/uL   Lymphocytes Absolute 1.9 0.7 - 3.1 x10E3/uL   Monocytes Absolute 0.5 0.1 - 0.9 x10E3/uL   EOS (ABSOLUTE) 0.2 0.0 - 0.4 x10E3/uL   Basophils Absolute 0.0 0.0 - 0.2 x10E3/uL   Immature Granulocytes 0 Not Estab. %   Immature Grans (Abs) 0.0 0.0 - 0.1 x10E3/uL  Comp Met (CMET)  Result Value Ref Range   Glucose 88 65 - 99 mg/dL   BUN 7 (L) 8 - 27 mg/dL   Creatinine, Ser 0.74 0.57 - 1.00 mg/dL   GFR calc non Af Amer 84 >59 mL/min/1.73   GFR calc Af Amer 96 >59 mL/min/1.73   BUN/Creatinine Ratio 9 (L) 12 - 28   Sodium 142 134 - 144 mmol/L   Potassium 4.4 3.5 - 5.2 mmol/L   Chloride 101 96 - 106 mmol/L   CO2 24 20 - 29 mmol/L   Calcium 9.8 8.7 - 10.3 mg/dL   Total Protein 7.7 6.0 - 8.5 g/dL   Albumin 4.2 3.6 - 4.8 g/dL   Globulin, Total 3.5 1.5 - 4.5 g/dL   Albumin/Globulin Ratio 1.2 1.2 - 2.2   Bilirubin Total 0.6 0.0 - 1.2 mg/dL   Alkaline Phosphatase 85 39 - 117 IU/L   AST 21 0 - 40 IU/L   ALT 16 0 - 32 IU/L  Lipid Panel w/o Chol/HDL Ratio  Result Value Ref Range   Cholesterol, Total 173 100 - 199 mg/dL   Triglycerides 87 0 - 149 mg/dL   HDL 60 >39 mg/dL   VLDL Cholesterol Cal 17 5 - 40 mg/dL   LDL Calculated 96 0 - 99 mg/dL  UA/M w/rflx Culture, Routine   Specimen: Urine   URINE  Result Value Ref Range   Specific Gravity, UA <1.005 (L) 1.005 - 1.030   pH, UA 6.0 5.0 - 7.5   Color, UA Yellow Yellow   Appearance Ur Clear Clear   Leukocytes, UA Negative Negative   Protein, UA Negative Negative/Trace   Glucose, UA Negative Negative   Ketones, UA Negative Negative   RBC, UA 2+ (A) Negative   Bilirubin,  UA Negative Negative   Urobilinogen, Ur 0.2 0.2 - 1.0 mg/dL   Nitrite, UA Negative Negative   Microscopic Examination See below:       Assessment & Plan:   Problem List Items Addressed This Visit  Respiratory   COPD exacerbation ([facility]) - Primary    Suspect COPD exacerbation, but have recommended Covid testing and provided directions on how to obtain.  Will obtain CXR due to h/o PNA.  Scripts sent for Zpack, Prednisone, and Tessalon.  If PNA present will add on second abx.  Albuterol inhaler refilled.  Recommend plenty of rest and fluids.  If any worsening symptoms she is to be seen immediately in ER setting or return to office. Will follow-up with her in 2 weeks.      Relevant Medications   azithromycin (ZITHROMAX) 250 MG tablet   predniSONE (DELTASONE) 10 MG tablet   benzonatate (TESSALON PERLES) 100 MG capsule   albuterol (VENTOLIN HFA) 108 (90 Base) MCG/ACT inhaler   Other Relevant Orders   Novel Coronavirus, NAA ([facility])   DG Chest 2 View (Completed)     Other   Nicotine dependence, cigarettes, uncomplicated    I have recommended complete cessation of tobacco use. I have discussed various options available for assistance with tobacco cessation including over the counter methods (Nicotine gum, patch and lozenges). We also discussed prescription options (Chantix, Nicotine Inhaler / Nasal Spray). The patient is not interested in pursuing any prescription tobacco cessation options at this time.           Follow up plan: Return in about 2 weeks (around 06/05/2019) for COPD exacerbation.

## 2019-05-22 NOTE — Progress Notes (Signed)
HIPAA compliant message left with results, she is starting on abx and Prednisone + to obtain Covid testing.  Will return in 2 weeks or sooner if worsening.

## 2019-05-26 ENCOUNTER — Encounter: Payer: Self-pay | Admitting: Nurse Practitioner

## 2019-05-26 NOTE — Progress Notes (Signed)
Lvm to make this apt sent letter 

## 2019-06-02 ENCOUNTER — Other Ambulatory Visit: Payer: Self-pay

## 2019-06-02 ENCOUNTER — Encounter: Payer: Self-pay | Admitting: Nurse Practitioner

## 2019-06-02 ENCOUNTER — Ambulatory Visit (INDEPENDENT_AMBULATORY_CARE_PROVIDER_SITE_OTHER): Payer: PPO | Admitting: Nurse Practitioner

## 2019-06-02 DIAGNOSIS — J441 Chronic obstructive pulmonary disease with (acute) exacerbation: Secondary | ICD-10-CM | POA: Diagnosis not present

## 2019-06-02 MED ORDER — AMOXICILLIN-POT CLAVULANATE 875-125 MG PO TABS: 1.0000 | ORAL_TABLET | Freq: Two times a day (BID) | ORAL | 0 refills | Status: AC

## 2019-06-02 MED ORDER — BUDESONIDE-FORMOTEROL FUMARATE 160-4.5 MCG/ACT IN AERO: 2.0000 | INHALATION_SPRAY | Freq: Two times a day (BID) | RESPIRATORY_TRACT | 3 refills | Status: AC

## 2019-06-02 MED ORDER — BENZONATATE 100 MG PO CAPS: 100.0000 mg | ORAL_CAPSULE | Freq: Three times a day (TID) | ORAL | 0 refills | Status: DC | PRN

## 2019-06-02 MED ORDER — PREDNISONE 20 MG PO TABS: 40.0000 mg | ORAL_TABLET | Freq: Every day | ORAL | 0 refills | Status: AC

## 2019-06-02 NOTE — Assessment & Plan Note (Addendum)
Acute, with 50% improvement.  Initial sats on lower side with mask on and anxiety, repeat improved.  Continues to have ongoing cough.  Have recommended obtaining outpatient CT to further evaluate lungs, refuses at this time wishing to obtain once feeling better, discussed at length with her.  At this time will send in script for Augmentin for extended period of time + Prednisone 40 MG x 5 days.  Refill on Tessalon sent.  Will start Symbicort for daily regimen and continue to use Albuterol as needed.  Recommend use of Claritin at home + increase rest and hydration.  CCM referral to assist with cost of maintenance inhaler, would prefer LABA/LAMA combo for patient, but on review formulary do not see coverage.  Recommend complete smoking cessation.  Return in 2 weeks, if worsening symptoms immediately go to ER (patient agrees with this plan -- at this time wishes to avoid UC or ER, but will attend if worsening).

## 2019-06-02 NOTE — Progress Notes (Addendum)
BP 133/84   Pulse 96   Temp (!) 97.4 F (36.3 C) (Oral)   Ht _0  (1.676 m)   Wt 160 lb (72.6 kg)   SpO2 92% Comment: RA  BMI 25.82 kg/m    Subjective:    Patient ID: Dorothy Savage, female    DOB: [birth year], 69 y.o.   MRN: [id number]  HPI: Dorothy Savage is a 69 y.o. female  Chief Complaint  Patient presents with  . COPD   COPD Treated for exacerbation on 05/22/2019, Azithromycin and Prednisone provided.  She is a smoker, 1/2 PPD.  No loss of taste or smell.  Has not been around anyone with Covid that she knows of, is not leaving her house and having everything delivered to her. Denies loss of taste or smell.  No N&V, diarrhea, abdominal pain.  Did test negative for Covid per her report at [facility].  At this time reports not feeling 100%, feeling about 50%.    Chest imaging on 05/22/2019 showed "Significantly increased diffuse reticular densities are seen throughout both lungs most consistent with acute inflammation or  edema superimposed upon background of chronic interstitial scarring or fibrosis.". COPD status: exacerbated Satisfied with current treatment?: yes Oxygen use: no Dyspnea frequency: mild Cough frequency: moderate Rescue inhaler frequency:  3-4 times a day Limitation of activity: no Productive cough: yes Last Spirometry: 01/28/2018 Pneumovax: Not up to Date Influenza: refused  Relevant past medical, surgical, family and social history reviewed and updated as indicated. Interim medical history since our last visit reviewed. Allergies and medications reviewed and updated.  Review of Systems  Constitutional: Negative for activity change, appetite change, diaphoresis, fatigue and fever.  HENT: Positive for postnasal drip and rhinorrhea. Negative for congestion, ear discharge, ear pain, sinus pressure, sinus pain, sneezing, sore throat and voice change.   Respiratory: Positive for cough, shortness of breath and wheezing. Negative for chest tightness.     Cardiovascular: Negative for chest pain, palpitations and leg swelling.  Gastrointestinal: Negative.   Neurological: Negative.   Psychiatric/Behavioral: Negative.     Per HPI unless specifically indicated above     Objective:    BP 133/84   Pulse 96   Temp (!) 97.4 F (36.3 C) (Oral)   Ht _1  (1.676 m)   Wt 160 lb (72.6 kg)   SpO2 92% Comment: RA  BMI 25.82 kg/m   Wt Readings from Last 3 Encounters:  06/02/19 160 lb (72.6 kg)  12/17/18 167 lb (75.8 kg)  01/28/18 161 lb (73 kg)    Physical Exam Vitals and nursing note reviewed.  Constitutional:      General: She is awake. She is not in acute distress.    Appearance: She is well-developed and well-groomed. She is ill-appearing.  HENT:     Head: Normocephalic.     Right Ear: Hearing normal.     Left Ear: Hearing normal.  Eyes:     General: Lids are normal.        Right eye: No discharge.        Left eye: No discharge.     Conjunctiva/sclera: Conjunctivae normal.     Pupils: Pupils are equal, round, and reactive to light.  Neck:     Vascular: No carotid bruit.  Cardiovascular:     Rate and Rhythm: Normal rate and regular rhythm.     Heart sounds: Normal heart sounds. No murmur. No gallop.   Pulmonary:     Effort: Pulmonary effort is normal. No  accessory muscle usage or respiratory distress.     Breath sounds: Decreased breath sounds and wheezing present.     Comments: Expiratory wheezes throughout with diminished breath sounds and occasional rhonchi bilateral bases. Abdominal:     General: Bowel sounds are normal.     Palpations: Abdomen is soft.  Musculoskeletal:     Cervical back: Normal range of motion and neck supple.     Right lower leg: No edema.     Left lower leg: No edema.  Lymphadenopathy:     Cervical: No cervical adenopathy.  Skin:    General: Skin is warm and dry.  Neurological:     Mental Status: She is alert and oriented to person, place, and time.  Psychiatric:        Attention and  Perception: Attention normal.        Mood and Affect: Mood normal.        Speech: Speech normal.        Behavior: Behavior normal. Behavior is cooperative.    Results for orders placed or performed in visit on 01/28/18  Microscopic Examination   URINE  Result Value Ref Range   WBC, UA 0-5 0 - 5 /hpf   RBC, UA 3-10 (A) 0 - 2 /hpf   Epithelial Cells (non renal) 0-10 0 - 10 /hpf   Bacteria, UA None seen None seen/Few  HgB A1c  Result Value Ref Range   Hgb A1c MFr Bld 5.1 4.8 - 5.6 %   Est. average glucose Bld gHb Est-mCnc 100 mg/dL  TSH  Result Value Ref Range   TSH 2.410 0.450 - 4.500 uIU/mL  CBC with Differential/Platelet  Result Value Ref Range   WBC 6.8 3.4 - 10.8 x10E3/uL   RBC 5.89 (H) 3.77 - 5.28 x10E6/uL   Hemoglobin 18.5 (H) 11.1 - 15.9 g/dL   Hematocrit 53.8 (H) 34.0 - 46.6 %   MCV 91 79 - 97 fL   MCH 31.4 26.6 - 33.0 pg   MCHC 34.4 31.5 - 35.7 g/dL   RDW 12.7 12.3 - 15.4 %   Platelets 171 150 - 450 x10E3/uL   Neutrophils 61 Not Estab. %   Lymphs 28 Not Estab. %   Monocytes 7 Not Estab. %   Eos 3 Not Estab. %   Basos 1 Not Estab. %   Neutrophils Absolute 4.1 1.4 - 7.0 x10E3/uL   Lymphocytes Absolute 1.9 0.7 - 3.1 x10E3/uL   Monocytes Absolute 0.5 0.1 - 0.9 x10E3/uL   EOS (ABSOLUTE) 0.2 0.0 - 0.4 x10E3/uL   Basophils Absolute 0.0 0.0 - 0.2 x10E3/uL   Immature Granulocytes 0 Not Estab. %   Immature Grans (Abs) 0.0 0.0 - 0.1 x10E3/uL  Comp Met (CMET)  Result Value Ref Range   Glucose 88 65 - 99 mg/dL   BUN 7 (L) 8 - 27 mg/dL   Creatinine, Ser 0.74 0.57 - 1.00 mg/dL   GFR calc non Af Amer 84 >59 mL/min/1.73   GFR calc Af Amer 96 >59 mL/min/1.73   BUN/Creatinine Ratio 9 (L) 12 - 28   Sodium 142 134 - 144 mmol/L   Potassium 4.4 3.5 - 5.2 mmol/L   Chloride 101 96 - 106 mmol/L   CO2 24 20 - 29 mmol/L   Calcium 9.8 8.7 - 10.3 mg/dL   Total Protein 7.7 6.0 - 8.5 g/dL   Albumin 4.2 3.6 - 4.8 g/dL   Globulin, Total 3.5 1.5 - 4.5 g/dL   Albumin/Globulin Ratio 1.2  1.2 -  2.2   Bilirubin Total 0.6 0.0 - 1.2 mg/dL   Alkaline Phosphatase 85 39 - 117 IU/L   AST 21 0 - 40 IU/L   ALT 16 0 - 32 IU/L  Lipid Panel w/o Chol/HDL Ratio  Result Value Ref Range   Cholesterol, Total 173 100 - 199 mg/dL   Triglycerides 87 0 - 149 mg/dL   HDL 60 >39 mg/dL   VLDL Cholesterol Cal 17 5 - 40 mg/dL   LDL Calculated 96 0 - 99 mg/dL  UA/M w/rflx Culture, Routine   Specimen: Urine   URINE  Result Value Ref Range   Specific Gravity, UA <1.005 (L) 1.005 - 1.030   pH, UA 6.0 5.0 - 7.5   Color, UA Yellow Yellow   Appearance Ur Clear Clear   Leukocytes, UA Negative Negative   Protein, UA Negative Negative/Trace   Glucose, UA Negative Negative   Ketones, UA Negative Negative   RBC, UA 2+ (A) Negative   Bilirubin, UA Negative Negative   Urobilinogen, Ur 0.2 0.2 - 1.0 mg/dL   Nitrite, UA Negative Negative   Microscopic Examination See below:       Assessment & Plan:   Problem List Items Addressed This Visit      Respiratory   COPD exacerbation ([facility])    Acute, with 50% improvement.  Initial sats on lower side with mask on and anxiety, repeat improved.  Continues to have ongoing cough.  Have recommended obtaining outpatient CT to further evaluate lungs, refuses at this time wishing to obtain once feeling better, discussed at length with her.  At this time will send in script for Augmentin for extended period of time + Prednisone 40 MG x 5 days.  Refill on Tessalon sent.  Will start Symbicort for daily regimen and continue to use Albuterol as needed.  Recommend use of Claritin at home + increase rest and hydration.  CCM referral to assist with cost of maintenance inhaler, would prefer LABA/LAMA combo for patient, but on review formulary do not see coverage.  Recommend complete smoking cessation.  Return in 2 weeks, if worsening symptoms immediately go to ER (patient agrees with this plan -- at this time wishes to avoid UC or ER, but will attend if worsening).        Relevant Medications   budesonide-formoterol (SYMBICORT) 160-4.5 MCG/ACT inhaler   predniSONE (DELTASONE) 20 MG tablet   benzonatate (TESSALON PERLES) 100 MG capsule       Follow up plan: Return in about 2 weeks (around 06/16/2019) for COPD exacerbation.

## 2019-06-02 NOTE — Patient Instructions (Signed)

## 2019-06-11 ENCOUNTER — Encounter: Payer: Self-pay | Admitting: Nurse Practitioner

## 2019-06-11 ENCOUNTER — Ambulatory Visit (INDEPENDENT_AMBULATORY_CARE_PROVIDER_SITE_OTHER): Payer: PPO

## 2019-06-11 VITALS — BP 122/78 | Wt 157.0 lb

## 2019-06-11 DIAGNOSIS — Z0000 Encounter for general adult medical examination without abnormal findings: Secondary | ICD-10-CM | POA: Diagnosis not present

## 2019-06-11 NOTE — Progress Notes (Signed)
Subjective:   Dorothy Savage is a 69 y.o. female who presents for an Initial Medicare Annual Wellness Visit.  This visit is being conducted via phone call  - after an attmept to do on video chat - due to the COVID-19 pandemic. This patient has given me verbal consent via phone to conduct this visit, patient states they are participating from their home address. Some vital signs may be absent or patient reported.   Patient identification: identified by name, DOB, and current address.    Review of Systems      Cardiac Risk Factors include: advanced age (>51men, >58 women);smoking/ tobacco exposure     Objective:    Today's Vitals   06/11/19 1015  BP: 122/78  Weight: 157 lb (71.2 kg)   Body mass index is 25.34 kg/m.  Advanced Directives 06/11/2019  Does Patient Have a Medical Advance Directive? No    Current Medications (verified) Outpatient Encounter Medications as of 06/11/2019  Medication Sig  . albuterol (VENTOLIN HFA) 108 (90 Base) MCG/ACT inhaler Inhale 2 puffs into the lungs every 6 (six) hours as needed for wheezing or shortness of breath.  Marland Kitchen amoxicillin-clavulanate (AUGMENTIN) 875-125 MG tablet Take 1 tablet by mouth 2 (two) times daily for 10 days.  . benzonatate (TESSALON PERLES) 100 MG capsule Take 1 capsule (100 mg total) by mouth 3 (three) times daily as needed for cough.  . budesonide-formoterol (SYMBICORT) 160-4.5 MCG/ACT inhaler Inhale 2 puffs into the lungs 2 (two) times daily.  Marland Kitchen ibuprofen (ADVIL,MOTRIN) 200 MG tablet Take 200 mg by mouth every 6 (six) hours as needed.  . traZODone (DESYREL) 50 MG tablet Take 0.5 tablets (25 mg total) by mouth at bedtime.   Facility-Administered Encounter Medications as of 06/11/2019  Medication  . triamcinolone cream (KENALOG) 0.1 %    Allergies (verified) Sulfa antibiotics and Sulfa antibiotics   History: History reviewed. No pertinent past medical history. Past Surgical History:  Procedure Laterality Date  .  ABDOMINAL HYSTERECTOMY    . TONSILLECTOMY     Family History  Problem Relation Age of Onset  . Alcohol abuse Father   . Diabetes Maternal Grandmother   . Diabetes Maternal Uncle    Social History   Socioeconomic History  . Marital status: Widowed    Spouse name: Not on file  . Number of children: Not on file  . Years of education: Not on file  . Highest education level: Not on file  Occupational History  . Occupation: retired   Tobacco Use  . Smoking status: Current Every Day Smoker    Packs/day: 0.50    Types: Cigarettes  . Smokeless tobacco: Never Used  Substance and Sexual Activity  . Alcohol use: Yes    Comment: Occassionally  . Drug use: Never  . Sexual activity: Not on file  Other Topics Concern  . 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   Has 3 children, 3 grandchildren.         Social Determinants of Health   Financial Resource Strain: Low Risk   . Difficulty of Paying Living Expenses: Not hard at all  Food Insecurity: No Food Insecurity  . Worried About Programme researcher, broadcasting/film/video in the Last Year: Never true  . Ran Out of Food in the Last Year: Never true  Transportation Needs: No Transportation Needs  . Lack of Transportation (Medical): No  .  Lack of Transportation (Non-Medical): No  Physical Activity:   . Days of Exercise per Week:   . Minutes of Exercise per Session:   Stress:   . Feeling of Stress :   Social Connections: Somewhat Isolated  . Frequency of Communication with Friends and Family: More than three times a week  . Frequency of Social Gatherings with Friends and Family: Once a week  . Attends Religious Services: Never  . Active Member of Clubs or Organizations: No  . Attends Banker Meetings: Never  . Marital Status: Living with partner    Tobacco Counseling Ready to quit: No Counseling given: Yes   Clinical Intake:  Pre-visit preparation completed:  Yes  Pain : No/denies pain     Nutritional Risks: None Diabetes: No  How often do you need to have someone help you when you read instructions, pamphlets, or other written materials from your doctor or pharmacy?: 1 - Never  Interpreter Needed?: No  Information entered by :: Caris Cerveny,LPN   Activities of Daily Living In your present state of health, do you have any difficulty performing the following activities: 06/11/2019 06/02/2019  Hearing? N N  Comment no hearing aids -  Vision? N Y  Comment eyeglasses, goes to eye dr annually -  Difficulty concentrating or making decisions? N Y  Walking or climbing stairs? N Y  Dressing or bathing? N Y  Doing errands, shopping? N Y  Quarry manager and eating ? N -  Using the Toilet? N -  In the past six months, have you accidently leaked urine? Y -  Comment pads for protection -  Do you have problems with loss of bowel control? N -  Managing your Medications? N -  Managing your Finances? N -  Housekeeping or managing your Housekeeping? N -  Some recent data might be hidden     Immunizations and Health Maintenance Immunization History  Administered Date(s) Administered  . Hepatitis B 11/09/2003  . Td 11/09/2003   There are no preventive care reminders to display for this patient.  Patient Care Team: Marjie Skiff, NP as PCP - General (Nurse Practitioner) Dianne Dun, MD (Family Medicine)  Indicate any recent Medical Services you may have received from other than [facility] providers in the past year (date may be approximate).     Assessment:   This is a routine wellness examination for [name].  Hearing/Vision screen  Hearing Screening   125Hz  250Hz  500Hz  1000Hz  2000Hz  3000Hz  4000Hz  6000Hz  8000Hz   Right ear:           Left ear:           Vision Screening Comments: Goes to eye dr annually   Dietary issues and exercise activities discussed: Current Exercise Habits: The patient does not participate in regular exercise at  present, Exercise limited by: None identified  Goals Addressed   None    Depression Screen PHQ 2/9 Scores 06/11/2019 01/28/2018  PHQ - 2 Score 0 1  PHQ- 9 Score - 4    Fall Risk Fall Risk  06/11/2019 06/02/2019 01/04/2018  Falls in the past year? 0 0 No  Number falls in past yr: 0 0 -  Injury with Fall? 0 0 -   FALL RISK PREVENTION PERTAINING TO THE HOME:  Any stairs in or around the home? No  If so, are there any without handrails? No   Home free of loose throw rugs in walkways, pet beds, electrical cords, etc? Yes  Adequate lighting  in your home to reduce risk of falls? Yes   ASSISTIVE DEVICES UTILIZED TO PREVENT FALLS:  Life alert? No  Use of a cane, walker or w/c? No  Grab bars in the bathroom? No  Shower chair or bench in shower? No  Elevated toilet seat or a handicapped toilet? No    DME ORDERS:  DME order needed?  No   TIMED UP AND GO:  Unable to perform    Cognitive Function:        Screening Tests Health Maintenance  Topic Date Due  . INFLUENZA VACCINE  06/18/2019 (Originally 10/19/2018)  . MAMMOGRAM  06/10/2020 (Originally 01/30/2013)  . DEXA SCAN  06/10/2020 (Originally 02/09/2014)  . COLONOSCOPY  06/10/2020 (Originally 02/10/1999)  . TETANUS/TDAP  06/10/2020 (Originally 11/08/2013)  . Hepatitis C Screening  06/10/2020 (Originally [birth year])  . PNA vac Low Risk Adult (1 of 2 - PCV13) 06/10/2020 (Originally 02/09/2014)    Qualifies for Shingles Vaccine? Declined   Tdap: declined   Flu Vaccine: declined  Pneumococcal Vaccine: declined    Covid-19 Vaccine: information   Cancer Screenings:  Colorectal Screening: declined all screening   Mammogram: declined   Bone Density: declined   Lung Cancer Screening: (Low Dose CT Chest recommended if Age 51-80 years, 30 pack-year currently smoking OR have quit w/in 15years.) does qualify.   Declined    Additional Screening:  Hepatitis C Screening: does qualify; declined  Vision Screening:  Recommended annual ophthalmology exams for early detection of glaucoma and other disorders of the eye. Is the patient up to date with their annual eye exam?  Yes  Who is the provider or what is the name of the office in which the pt attends annual eye exams? doenst know name    Dental Screening: Recommended annual dental exams for proper oral hygiene  Community Resource Referral:  CRR required this visit?  No       Plan:  I have personally reviewed and addressed the Medicare Annual Wellness questionnaire and have noted the following in the patient's chart:  A. Medical and social history B. Use of alcohol, tobacco or illicit drugs  C. Current medications and supplements D. Functional ability and status E.  Nutritional status F.  Physical activity G. Advance directives H. List of other physicians I.  Hospitalizations, surgeries, and ER visits in previous 12 months J.  [place] such as hearing and vision if needed, cognitive and depression L. Referrals and appointments   In addition, I have reviewed and discussed with patient certain preventive protocols, quality metrics, and best practice recommendations. A written personalized care plan for preventive services as well as general preventive health recommendations were provided to patient.   Signed,    Bevelyn Ngo, LPN   [id number]  Nurse Health Advisor   Nurse Notes: none

## 2019-06-11 NOTE — Patient Instructions (Signed)
Dorothy Savage , Thank you for taking time to come for your Medicare Wellness Visit. I appreciate your ongoing commitment to your health goals. Please review the following plan we discussed and let me know if I can assist you in the future.   Screening recommendations/referrals: Colonoscopy: declined  Mammogram: declined  Bone Density: declined  Recommended yearly ophthalmology/optometry visit for glaucoma screening and checkup Recommended yearly dental visit for hygiene and checkup  Vaccinations: Influenza vaccine: declined  Pneumococcal vaccine: declined  Tdap vaccine: declined  Shingles vaccine: declined    Covid-19:declined   Advanced directives: Advance directive discussed with you today.  Once this is complete please bring a copy in to our office so we can scan it into your chart.  Conditions/risks identified: current smoker, discussed lung cancer screening, declined   Next appointment: Follow up in one year for your annual wellness visit    Preventive Care 65 Years and Older, Female Preventive care refers to lifestyle choices and visits with your health care provider that can promote health and wellness. What does preventive care include?  A yearly physical exam. This is also called an annual well check.  Dental exams once or twice a year.  Routine eye exams. Ask your health care provider how often you should have your eyes checked.  Personal lifestyle choices, including:  Daily care of your teeth and gums.  Regular physical activity.  Eating a healthy diet.  Avoiding tobacco and drug use.  Limiting alcohol use.  Practicing safe sex.  Taking low-dose aspirin every day.  Taking vitamin and mineral supplements as recommended by your health care provider. What happens during an annual well check? The services and screenings done by your health care provider during your annual well check will depend on your age, overall health, lifestyle risk factors, and family  history of disease. Counseling  Your health care provider may ask you questions about your:  Alcohol use.  Tobacco use.  Drug use.  Emotional well-being.  Home and relationship well-being.  Sexual activity.  Eating habits.  History of falls.  Memory and ability to understand (cognition).  Work and work Astronomer.  Reproductive health. Screening  You may have the following tests or measurements:  Height, weight, and BMI.  Blood pressure.  Lipid and cholesterol levels. These may be checked every 5 years, or more frequently if you are over 59 years old.  Skin check.  Lung cancer screening. You may have this screening every year starting at age 33 if you have a 30-pack-year history of smoking and currently smoke or have quit within the past 15 years.  Fecal occult blood test (FOBT) of the stool. You may have this test every year starting at age 34.  Flexible sigmoidoscopy or colonoscopy. You may have a sigmoidoscopy every 5 years or a colonoscopy every 10 years starting at age 77.  Hepatitis C blood test.  Hepatitis B blood test.  Sexually transmitted disease (STD) testing.  Diabetes screening. This is done by checking your blood sugar (glucose) after you have not eaten for a while (fasting). You may have this done every 1-3 years.  Bone density scan. This is done to screen for osteoporosis. You may have this done starting at age 34.  Mammogram. This may be done every 1-2 years. Talk to your health care provider about how often you should have regular mammograms. Talk with your health care provider about your test results, treatment options, and if necessary, the need for more tests. Vaccines  Your  health care provider may recommend certain vaccines, such as:  Influenza vaccine. This is recommended every year.  Tetanus, diphtheria, and acellular pertussis (Tdap, Td) vaccine. You may need a Td booster every 10 years.  Zoster vaccine. You may need this after  age 70.  Pneumococcal 13-valent conjugate (PCV13) vaccine. One dose is recommended after age 30.  Pneumococcal polysaccharide (PPSV23) vaccine. One dose is recommended after age 16. Talk to your health care provider about which screenings and vaccines you need and how often you need them. This information is not intended to replace advice given to you by your health care provider. Make sure you discuss any questions you have with your health care provider. Document Released: 04/02/2015 Document Revised: 11/24/2015 Document Reviewed: 01/05/2015 Elsevier Interactive Patient Education  2017 [place] Prevention in the Home Falls can cause injuries. They can happen to people of all ages. There are many things you can do to make your home safe and to help prevent falls. What can I do on the outside of my home?  Regularly fix the edges of walkways and driveways and fix any cracks.  Remove anything that might make you trip as you walk through a door, such as a raised step or threshold.  Trim any bushes or trees on the path to your home.  Use bright outdoor lighting.  Clear any walking paths of anything that might make someone trip, such as rocks or tools.  Regularly check to see if handrails are loose or broken. Make sure that both sides of any steps have handrails.  Any raised decks and porches should have guardrails on the edges.  Have any leaves, snow, or ice cleared regularly.  Use sand or salt on walking paths during winter.  Clean up any spills in your garage right away. This includes oil or grease spills. What can I do in the bathroom?  Use night lights.  Install grab bars by the toilet and in the tub and shower. Do not use towel bars as grab bars.  Use non-skid mats or decals in the tub or shower.  If you need to sit down in the shower, use a plastic, non-slip stool.  Keep the floor dry. Clean up any water that spills on the floor as soon as it  happens.  Remove soap buildup in the tub or shower regularly.  Attach bath mats securely with double-sided non-slip rug tape.  Do not have throw rugs and other things on the floor that can make you trip. What can I do in the bedroom?  Use night lights.  Make sure that you have a light by your bed that is easy to reach.  Do not use any sheets or blankets that are too big for your bed. They should not hang down onto the floor.  Have a firm chair that has side arms. You can use this for support while you get dressed.  Do not have throw rugs and other things on the floor that can make you trip. What can I do in the kitchen?  Clean up any spills right away.  Avoid walking on wet floors.  Keep items that you use a lot in easy-to-reach places.  If you need to reach something above you, use a strong step stool that has a grab bar.  Keep electrical cords out of the way.  Do not use floor polish or wax that makes floors slippery. If you must use wax, use non-skid floor wax.  Do not  have throw rugs and other things on the floor that can make you trip. What can I do with my stairs?  Do not leave any items on the stairs.  Make sure that there are handrails on both sides of the stairs and use them. Fix handrails that are broken or loose. Make sure that handrails are as long as the stairways.  Check any carpeting to make sure that it is firmly attached to the stairs. Fix any carpet that is loose or worn.  Avoid having throw rugs at the top or bottom of the stairs. If you do have throw rugs, attach them to the floor with carpet tape.  Make sure that you have a light switch at the top of the stairs and the bottom of the stairs. If you do not have them, ask someone to add them for you. What else can I do to help prevent falls?  Wear shoes that:  Do not have high heels.  Have rubber bottoms.  Are comfortable and fit you well.  Are closed at the toe. Do not wear sandals.  If you  use a stepladder:  Make sure that it is fully opened. Do not climb a closed stepladder.  Make sure that both sides of the stepladder are locked into place.  Ask someone to hold it for you, if possible.  Clearly mark and make sure that you can see:  Any grab bars or handrails.  First and last steps.  Where the edge of each step is.  Use tools that help you move around (mobility aids) if they are needed. These include:  Canes.  Walkers.  Scooters.  Crutches.  Turn on the lights when you go into a dark area. Replace any light bulbs as soon as they burn out.  Set up your furniture so you have a clear path. Avoid moving your furniture around.  If any of your floors are uneven, fix them.  If there are any pets around you, be aware of where they are.  Review your medicines with your doctor. Some medicines can make you feel dizzy. This can increase your chance of falling. Ask your doctor what other things that you can do to help prevent falls. This information is not intended to replace advice given to you by your health care provider. Make sure you discuss any questions you have with your health care provider. Document Released: 12/31/2008 Document Revised: 08/12/2015 Document Reviewed: 04/10/2014 Elsevier Interactive Patient Education  2017 [employer].

## 2019-06-16 ENCOUNTER — Ambulatory Visit: Payer: PPO | Admitting: Nurse Practitioner

## 2019-08-02 ENCOUNTER — Other Ambulatory Visit: Payer: Self-pay | Admitting: Internal Medicine

## 2019-08-19 DEATH — deceased

## 2022-01-18 IMAGING — DX DG CHEST 2V
2 series · 2 of 2 positions shown · non-contrast
Comparison: January 29, 2014.

CLINICAL DATA: Cough, shortness of breath.

EXAM:
CHEST - 2 VIEW

[chest pa]
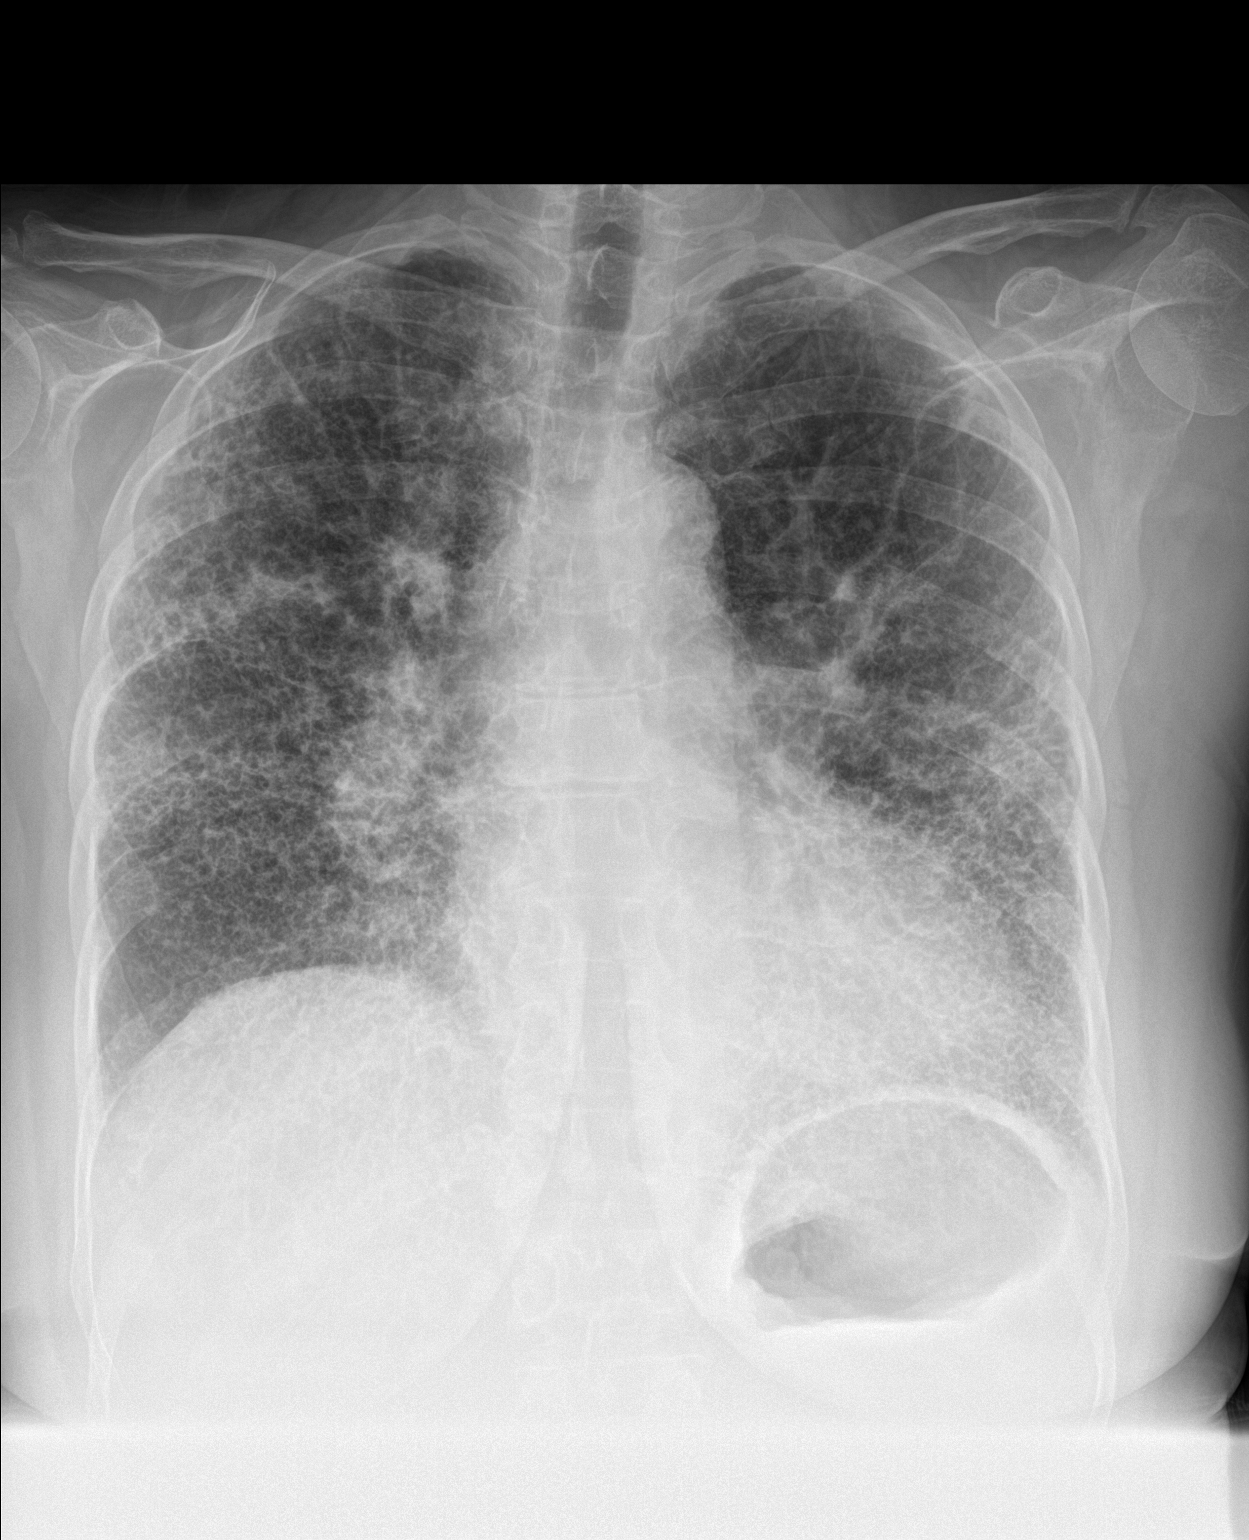

[chest lat]
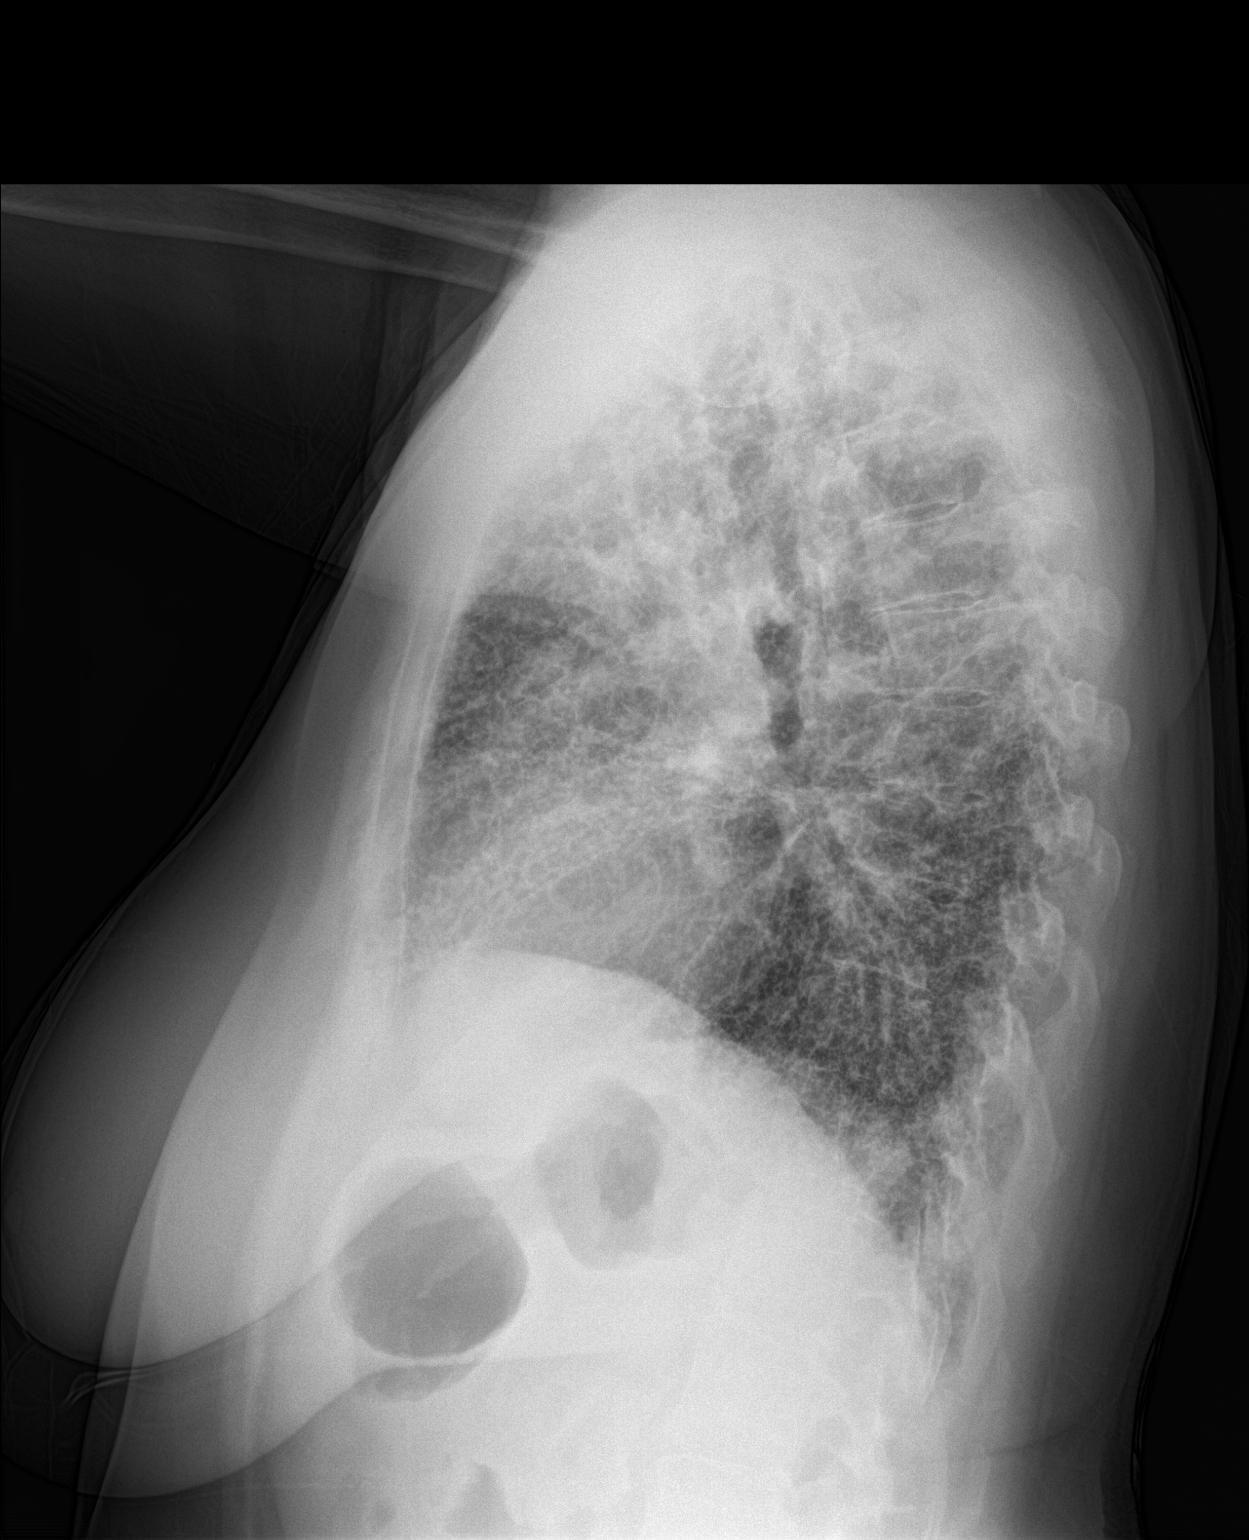

[2 of 2 positions shown; findings below may reference images not displayed]

FINDINGS: The heart size and mediastinal contours are within normal limits. No
pneumothorax or pleural effusion is noted. Significantly increased
diffuse reticular densities are seen throughout both lungs most
consistent with acute inflammation or edema superimposed upon
background of chronic interstitial scarring or fibrosis. The
visualized skeletal structures are unremarkable.
IMPRESSION: Significantly increased diffuse reticular densities are seen
throughout both lungs most consistent with acute inflammation or
edema superimposed upon background of chronic interstitial scarring
or fibrosis.
